# Patient Record
Sex: Female | Born: 1955 | Race: White | Hispanic: No | Marital: Single | Smoking: Never smoker
Health system: Southern US, Community
[De-identification: ages and names within clinical notes are randomized; demographics above are authoritative.]

## PROBLEM LIST (undated history)

## (undated) DIAGNOSIS — R002 Palpitations: Secondary | ICD-10-CM

## (undated) DIAGNOSIS — K5790 Diverticulosis of intestine, part unspecified, without perforation or abscess without bleeding: Secondary | ICD-10-CM

## (undated) DIAGNOSIS — H409 Unspecified glaucoma: Secondary | ICD-10-CM

## (undated) DIAGNOSIS — B9689 Other specified bacterial agents as the cause of diseases classified elsewhere: Secondary | ICD-10-CM

## (undated) DIAGNOSIS — M8580 Other specified disorders of bone density and structure, unspecified site: Secondary | ICD-10-CM

## (undated) DIAGNOSIS — A6000 Herpesviral infection of urogenital system, unspecified: Secondary | ICD-10-CM

## (undated) DIAGNOSIS — R87810 Cervical high risk human papillomavirus (HPV) DNA test positive: Secondary | ICD-10-CM

## (undated) DIAGNOSIS — N760 Acute vaginitis: Secondary | ICD-10-CM

## (undated) DIAGNOSIS — K219 Gastro-esophageal reflux disease without esophagitis: Secondary | ICD-10-CM

## (undated) DIAGNOSIS — M1990 Unspecified osteoarthritis, unspecified site: Secondary | ICD-10-CM

## (undated) DIAGNOSIS — R011 Cardiac murmur, unspecified: Secondary | ICD-10-CM

## (undated) DIAGNOSIS — R519 Headache, unspecified: Secondary | ICD-10-CM

## (undated) DIAGNOSIS — T7840XA Allergy, unspecified, initial encounter: Secondary | ICD-10-CM

## (undated) DIAGNOSIS — G454 Transient global amnesia: Secondary | ICD-10-CM

## (undated) HISTORY — DX: Acute vaginitis: N76.0

## (undated) HISTORY — PX: TONSILLECTOMY: SUR1361

## (undated) HISTORY — DX: Other specified bacterial agents as the cause of diseases classified elsewhere: B96.89

## (undated) HISTORY — DX: Allergy, unspecified, initial encounter: T78.40XA

## (undated) HISTORY — PX: FACELIFT: SHX1566

## (undated) HISTORY — PX: LIPOSUCTION: SHX10

## (undated) HISTORY — DX: Cervical high risk human papillomavirus (HPV) DNA test positive: R87.810

## (undated) HISTORY — DX: Unspecified glaucoma: H40.9

## (undated) HISTORY — DX: Herpesviral infection of urogenital system, unspecified: A60.00

## (undated) HISTORY — DX: Other specified disorders of bone density and structure, unspecified site: M85.80

## (undated) HISTORY — DX: Diverticulosis of intestine, part unspecified, without perforation or abscess without bleeding: K57.90

## (undated) HISTORY — PX: FOOT SURGERY: SHX648

---
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
## 1977-05-09 HISTORY — PX: CRYOTHERAPY: SHX1416

## 2004-03-10 ENCOUNTER — Ambulatory Visit: Payer: Self-pay

## 2005-03-23 ENCOUNTER — Ambulatory Visit: Payer: Self-pay | Admitting: Family Medicine

## 2005-04-13 ENCOUNTER — Ambulatory Visit: Payer: Self-pay | Admitting: Family Medicine

## 2006-02-20 ENCOUNTER — Ambulatory Visit: Payer: Self-pay | Admitting: Family Medicine

## 2006-04-19 ENCOUNTER — Ambulatory Visit: Payer: Self-pay | Admitting: Family Medicine

## 2006-08-25 ENCOUNTER — Ambulatory Visit: Payer: Self-pay | Admitting: Gastroenterology

## 2007-06-13 ENCOUNTER — Ambulatory Visit: Payer: Self-pay | Admitting: Family Medicine

## 2007-08-23 ENCOUNTER — Ambulatory Visit: Payer: Self-pay | Admitting: Family Medicine

## 2008-05-09 DIAGNOSIS — R87810 Cervical high risk human papillomavirus (HPV) DNA test positive: Secondary | ICD-10-CM

## 2008-05-09 HISTORY — DX: Cervical high risk human papillomavirus (HPV) DNA test positive: R87.810

## 2008-06-17 ENCOUNTER — Ambulatory Visit: Payer: Self-pay | Admitting: Family Medicine

## 2009-06-23 ENCOUNTER — Ambulatory Visit: Payer: Self-pay

## 2010-06-29 ENCOUNTER — Ambulatory Visit: Payer: Self-pay

## 2010-07-01 ENCOUNTER — Ambulatory Visit: Payer: Self-pay

## 2011-06-23 ENCOUNTER — Ambulatory Visit: Payer: Self-pay | Admitting: Gastroenterology

## 2011-07-05 ENCOUNTER — Ambulatory Visit: Payer: Self-pay

## 2011-07-29 ENCOUNTER — Ambulatory Visit: Payer: Self-pay | Admitting: Gastroenterology

## 2012-07-05 ENCOUNTER — Ambulatory Visit: Payer: Self-pay

## 2013-03-29 ENCOUNTER — Emergency Department: Payer: Self-pay | Admitting: Emergency Medicine

## 2013-03-29 LAB — URINALYSIS, COMPLETE
Bilirubin,UR: NEGATIVE
Blood: NEGATIVE
Hyaline Cast: 5
Leukocyte Esterase: NEGATIVE
Nitrite: NEGATIVE
Ph: 5 (ref 4.5–8.0)
Protein: 30
RBC,UR: 2 /HPF (ref 0–5)
Specific Gravity: 1.021 (ref 1.003–1.030)
Squamous Epithelial: 2

## 2013-03-29 LAB — CBC
HCT: 42 % (ref 35.0–47.0)
HGB: 14.4 g/dL (ref 12.0–16.0)
MCH: 33 pg (ref 26.0–34.0)
MCHC: 34.2 g/dL (ref 32.0–36.0)
MCV: 96 fL (ref 80–100)
Platelet: 222 10*3/uL (ref 150–440)
WBC: 6.4 10*3/uL (ref 3.6–11.0)

## 2013-03-29 LAB — BASIC METABOLIC PANEL
Anion Gap: 6 — ABNORMAL LOW (ref 7–16)
Calcium, Total: 8.7 mg/dL (ref 8.5–10.1)
Co2: 25 mmol/L (ref 21–32)
Glucose: 113 mg/dL — ABNORMAL HIGH (ref 65–99)
Sodium: 141 mmol/L (ref 136–145)

## 2013-03-29 LAB — TROPONIN I: Troponin-I: <0.02 ng/mL

## 2013-07-08 ENCOUNTER — Ambulatory Visit: Payer: Self-pay | Admitting: Obstetrics and Gynecology

## 2014-05-09 DIAGNOSIS — A6000 Herpesviral infection of urogenital system, unspecified: Secondary | ICD-10-CM

## 2014-05-09 HISTORY — DX: Herpesviral infection of urogenital system, unspecified: A60.00

## 2014-07-15 ENCOUNTER — Ambulatory Visit: Payer: Self-pay | Admitting: Obstetrics and Gynecology

## 2015-05-28 ENCOUNTER — Other Ambulatory Visit: Payer: Self-pay | Admitting: Obstetrics and Gynecology

## 2015-05-28 DIAGNOSIS — Z1231 Encounter for screening mammogram for malignant neoplasm of breast: Secondary | ICD-10-CM

## 2015-06-10 SURGERY — FIXATION, FEMUR, NECK, PERCUTANEOUS, USING SCREW
Anesthesia: Choice | Laterality: Left

## 2015-07-24 ENCOUNTER — Ambulatory Visit
Admission: RE | Admit: 2015-07-24 | Discharge: 2015-07-24 | Disposition: A | Discharge status: Home / Self Care | Payer: 59 | Source: Ambulatory Visit | Attending: Obstetrics and Gynecology | Admitting: Obstetrics and Gynecology

## 2015-07-24 DIAGNOSIS — Z1231 Encounter for screening mammogram for malignant neoplasm of breast: Secondary | ICD-10-CM | POA: Diagnosis not present

## 2015-08-10 ENCOUNTER — Ambulatory Visit
Admission: RE | Admit: 2015-08-10 | Discharge: 2015-08-10 | Disposition: A | Discharge status: Home / Self Care | Payer: 59 | Source: Ambulatory Visit | Attending: Family Medicine | Admitting: Family Medicine

## 2015-08-10 ENCOUNTER — Ambulatory Visit (INDEPENDENT_AMBULATORY_CARE_PROVIDER_SITE_OTHER): Payer: Self-pay | Admitting: Family Medicine

## 2015-08-10 ENCOUNTER — Encounter: Payer: Self-pay | Admitting: Family Medicine

## 2015-08-10 ENCOUNTER — Other Ambulatory Visit: Payer: Self-pay | Admitting: Family Medicine

## 2015-08-10 VITALS — BP 123/67 | HR 57 | Temp 98.5°F | Resp 16 | Ht 61.5 in | Wt 138.0 lb

## 2015-08-10 DIAGNOSIS — J189 Pneumonia, unspecified organism: Secondary | ICD-10-CM

## 2015-08-10 DIAGNOSIS — J309 Allergic rhinitis, unspecified: Secondary | ICD-10-CM | POA: Insufficient documentation

## 2015-08-10 DIAGNOSIS — R058 Other specified cough: Secondary | ICD-10-CM

## 2015-08-10 DIAGNOSIS — R05 Cough: Secondary | ICD-10-CM

## 2015-08-10 DIAGNOSIS — R918 Other nonspecific abnormal finding of lung field: Secondary | ICD-10-CM | POA: Diagnosis not present

## 2015-08-10 DIAGNOSIS — J301 Allergic rhinitis due to pollen: Secondary | ICD-10-CM

## 2015-08-10 MED ORDER — FLUTICASONE PROPIONATE 50 MCG/ACT NA SUSP: 2.0000 | Freq: Every day | NASAL | Status: DC

## 2015-08-10 MED ORDER — CETIRIZINE HCL 10 MG PO TABS: 10.0000 mg | ORAL_TABLET | Freq: Every day | ORAL | Status: DC

## 2015-08-10 MED ORDER — BENZONATATE 100 MG PO CAPS: 100.0000 mg | ORAL_CAPSULE | Freq: Three times a day (TID) | ORAL | Status: DC | PRN

## 2015-08-10 MED ORDER — DM-GUAIFENESIN ER 30-600 MG PO TB12: 1.0000 | ORAL_TABLET | Freq: Two times a day (BID) | ORAL | Status: DC

## 2015-08-10 MED ORDER — PREDNISONE 20 MG PO TABS: 40.0000 mg | ORAL_TABLET | Freq: Every day | ORAL | Status: AC

## 2015-08-10 NOTE — Progress Notes (Signed)
Subjective:    Patient ID: Kellie Willis, female    DOB: Oct 20, 1955, 60 y.o.   MRN: [id number]  HPI: Kellie Willis is a 60 y.o. female presenting on 08/10/2015 for Cough   HPI  Pt presents for cough x 3 weeks. She states it is improving over past 3 weeks. Doesn't keep her awake. Family member recently had the flu- she had fever and coughing 3 weeks ago- no formal flu diagnoses. Feels like there is something there. Cough is no longer productive- was previously productive- clear sputum. No chest tightness. No shortness of breath. No wheezing. No sinus pressure or congestion. No post nasal drip. 1 day of fever when initially sick.  Some history of Acid reflux. Taking flonase PRN for allergy symptoms.  Home treatment: Mucinex, Cough syrup- is effective but cough continues.  Past Medical History  Diagnosis Date  . Allergy     No current outpatient prescriptions on file prior to visit.   No current facility-administered medications on file prior to visit.    Review of Systems  Constitutional: Negative for fever and chills.  HENT: Positive for postnasal drip. Negative for congestion, ear discharge, ear pain and sore throat.   Respiratory: Positive for cough and shortness of breath. Negative for chest tightness and wheezing.   Cardiovascular: Negative for chest pain and leg swelling.  Gastrointestinal: Negative for nausea, vomiting, abdominal pain, diarrhea and constipation.  Endocrine: Negative.  Negative for cold intolerance, heat intolerance, polydipsia, polyphagia and polyuria.  Genitourinary: Negative for dysuria and difficulty urinating.  Musculoskeletal: Negative.   Neurological: Negative for dizziness, light-headedness and numbness.  Psychiatric/Behavioral: Negative.    Per HPI unless specifically indicated above     Objective:    BP 123/67 mmHg  Pulse 57  Temp(Src) 98.5 F (36.9 C) (Oral)  Resp 16  Ht 5' 1.5" (1.562 m)  Wt 138 lb (62.596 kg)  BMI 25.66 kg/m2  SpO2  97%  Wt Readings from Last 3 Encounters:  08/10/15 138 lb (62.596 kg)    Physical Exam  Constitutional: She is oriented to person, place, and time. She appears well-developed and well-nourished.  HENT:  Head: Normocephalic and atraumatic.  Right Ear: Hearing normal. Tympanic membrane is scarred.  Left Ear: Hearing and tympanic membrane normal.  Nose: Mucosal edema present. No rhinorrhea or sinus tenderness. Right sinus exhibits no maxillary sinus tenderness and no frontal sinus tenderness. Left sinus exhibits no maxillary sinus tenderness and no frontal sinus tenderness.  Mouth/Throat: Oropharynx is clear and moist and mucous membranes are normal. No posterior oropharyngeal edema, posterior oropharyngeal erythema or tonsillar abscesses.  Boggy turbinates.   Neck: Normal range of motion. Neck supple.  Cardiovascular: Normal rate, regular rhythm and normal heart sounds.  Exam reveals no gallop and no friction rub.   No murmur heard. Pulmonary/Chest: Effort normal and breath sounds normal. No respiratory distress. She has no decreased breath sounds. She has no wheezes. She has no rhonchi. She has no rales. Chest wall is not dull to percussion. She exhibits no tenderness.  Abdominal: Soft. Normal appearance and bowel sounds are normal.  Musculoskeletal: Normal range of motion. She exhibits no edema or tenderness.  Lymphadenopathy:    She has no cervical adenopathy.  Neurological: She is alert and oriented to person, place, and time.  Skin: Skin is warm and dry.   Results for orders placed or performed in visit on 03/29/13  Troponin I  Result Value Ref Range   Troponin-I < 0.02 ng/mL  CBC  Result Value Ref Range   WBC 6.4 3.6-11.0 x10 3/mm 3   RBC 4.36 3.80-5.20 X10 6/mm 3   HGB 14.4 12.0-16.0 g/dL   HCT 42.0 35.0-47.0 %   MCV 96 80-100 fL   MCH 33.0 26.0-34.0 pg   MCHC 34.2 32.0-36.0 g/dL   RDW 12.3 11.5-14.5 %   Platelet 222 150-440 x10 3/mm 3  Basic metabolic panel  Result Value  Ref Range   Glucose 113 (H) 65-99 mg/dL   BUN 18 7-18 mg/dL   Creatinine 0.98 0.60-1.30 mg/dL   Sodium 141 136-145 mmol/L   Potassium 3.5 3.5-5.1 mmol/L   Chloride 110 (H) 98-107 mmol/L   Co2 25 21-32 mmol/L   Calcium, Total 8.7 8.5-10.1 mg/dL   Osmolality 284 275-301   Anion Gap 6 (L) 7-16   EGFR (African American) >60    EGFR (Non-African Amer.) >60   Urinalysis, Complete  Result Value Ref Range   Color - urine Yellow    Clarity - urine Hazy    Glucose,UR Negative 0-75 mg/dL   Bilirubin,UR Negative NEGATIVE   Ketone Negative NEGATIVE   Specific Gravity 1.021 1.003-1.030   Blood Negative NEGATIVE   Ph 5.0 4.5-8.0   Protein 30 mg/dL NEGATIVE   Nitrite Negative NEGATIVE   Leukocyte Esterase Negative NEGATIVE   RBC,UR 2 /HPF 0-5 /HPF   WBC UR 1 /HPF 0-5 /HPF   Bacteria TRACE NONE SEEN   Squamous Epithelial 2 /HPF    Mucous PRESENT    Hyaline Cast 5 /LPF       Assessment & Plan:   Problem List Items Addressed This Visit      Respiratory   Allergic rhinitis - Primary    May be allergic componenet to cough. Encouraged pt to continue daily flonase and add 2nd generation anti-histamine to help ameliorate symptoms.       Relevant Medications   fluticasone (FLONASE) 50 MCG/ACT nasal spray   cetirizine (ZYRTEC) 10 MG tablet    Other Visit Diagnoses    Post-viral cough syndrome        Likely 2/2 possible flu 3 weeks ago. CXR to r/o pneumonia. Treat symptomatically. Consider prednisone. Alarm symptoms reviewed. Return if not improving.     Relevant Medications    benzonatate (TESSALON) 100 MG capsule    dextromethorphan-guaiFENesin (MUCINEX DM) 30-600 MG 12hr tablet    Other Relevant Orders    DG Chest 2 View       Meds ordered this encounter  Medications  . benzonatate (TESSALON) 100 MG capsule    Sig: Take 1 capsule (100 mg total) by mouth 3 (three) times daily as needed.    Dispense:  30 capsule    Refill:  0    Order Specific Question:  Supervising Provider     Answer:  Arlis Porta [[id number]]  . dextromethorphan-guaiFENesin (MUCINEX DM) 30-600 MG 12hr tablet    Sig: Take 1 tablet by mouth 2 (two) times daily.    Dispense:  20 tablet    Refill:  0    Order Specific Question:  Supervising Provider    Answer:  Arlis Porta [phone]  . fluticasone (FLONASE) 50 MCG/ACT nasal spray    Sig: Place 2 sprays into both nostrils daily.    Dispense:  16 g    Refill:  11    Order Specific Question:  Supervising Provider    Answer:  Arlis Porta [phone]  . cetirizine (ZYRTEC) 10 MG tablet  Sig: Take 1 tablet (10 mg total) by mouth daily.    Dispense:  30 tablet    Refill:  11    Order Specific Question:  Supervising Provider    Answer:  Arlis Porta [phone]      Follow up plan: Return if symptoms worsen or fail to improve.

## 2015-08-10 NOTE — Patient Instructions (Signed)
I think your cough is lingering inflammation from your previously illness. You can use supportive care at home to help with your symptoms. I have sent Mucinex DM to your pharmacy to help break up the congestion and soothe your cough. You can takes this twice daily.  I have also sent tesslon perles to your pharmacy to help with the cough- you can take these 3 times daily as needed. Honey is a natural cough suppressant- so add it to your tea in the morning.  If you have a humidifer, set that up in your bedroom at night.   Please seek immediate medical attention if you develop shortness of breath not relieve by inhaler, chest pain/tightness, fever > 103 F or other concerning symptoms.   IF you cough is not improving and you want to try prednisone, please let me know!

## 2015-08-10 NOTE — Assessment & Plan Note (Signed)
May be allergic componenet to cough. Encouraged pt to continue daily flonase and add 2nd generation anti-histamine to help ameliorate symptoms.

## 2015-08-13 ENCOUNTER — Telehealth: Payer: Self-pay | Admitting: Family Medicine

## 2015-08-13 NOTE — Telephone Encounter (Signed)
Called pt to ensure she got prednisone. She did get it. Started Wednesday. Feels like she is coughing more. Pt will call on Monday after finishing steroids if symptoms are not any better.

## 2015-08-14 ENCOUNTER — Telehealth: Payer: Self-pay | Admitting: Family Medicine

## 2015-08-14 MED ORDER — AZITHROMYCIN 250 MG PO TABS: ORAL_TABLET | ORAL | Status: DC

## 2015-08-14 NOTE — Telephone Encounter (Signed)
Pt said she is coughing up some yellow mucus now after taking the steroids.  Should she continue medication or do something different?  Please call [phone]

## 2015-08-14 NOTE — Telephone Encounter (Signed)
Pt advised as per Amy. 

## 2015-08-14 NOTE — Telephone Encounter (Signed)
Let's add an antibiotic to the steroids. I will send in a Zpak. 2 pills today and 1 pill daily until bottle complete. Please let her know! Thanks! AK

## 2015-08-18 ENCOUNTER — Telehealth: Payer: Self-pay | Admitting: Family Medicine

## 2015-08-18 DIAGNOSIS — R05 Cough: Secondary | ICD-10-CM

## 2015-08-18 DIAGNOSIS — R059 Cough, unspecified: Secondary | ICD-10-CM

## 2015-08-18 MED ORDER — PREDNISONE 10 MG PO TABS: ORAL_TABLET | ORAL | Status: DC

## 2015-08-18 NOTE — Telephone Encounter (Signed)
Pt has completed Zpak and is still coughing. Prednisone helped a little. Will try a longer round of prednisone. Will repeat CXR in 2 weeks and plan to send to pulmonology if inflammation has not improved.

## 2015-08-18 NOTE — Telephone Encounter (Signed)
Pt.wanted to know what she needed to do you have called  a Z pac in  , she had a Xray done she states she was still coughing

## 2015-09-08 ENCOUNTER — Encounter: Payer: Self-pay | Admitting: Family Medicine

## 2015-09-08 ENCOUNTER — Ambulatory Visit
Admission: RE | Admit: 2015-09-08 | Discharge: 2015-09-08 | Disposition: A | Discharge status: Home / Self Care | Payer: 59 | Source: Ambulatory Visit | Attending: Family Medicine | Admitting: Family Medicine

## 2015-09-08 ENCOUNTER — Other Ambulatory Visit: Payer: Self-pay | Admitting: Family Medicine

## 2015-09-08 ENCOUNTER — Ambulatory Visit: Payer: Self-pay | Admitting: Family Medicine

## 2015-09-08 DIAGNOSIS — J189 Pneumonia, unspecified organism: Secondary | ICD-10-CM

## 2015-09-08 DIAGNOSIS — Z8701 Personal history of pneumonia (recurrent): Secondary | ICD-10-CM | POA: Insufficient documentation

## 2015-09-08 DIAGNOSIS — Z09 Encounter for follow-up examination after completed treatment for conditions other than malignant neoplasm: Secondary | ICD-10-CM | POA: Insufficient documentation

## 2016-03-28 ENCOUNTER — Other Ambulatory Visit: Payer: Self-pay | Admitting: Obstetrics and Gynecology

## 2016-03-28 DIAGNOSIS — Z1231 Encounter for screening mammogram for malignant neoplasm of breast: Secondary | ICD-10-CM

## 2016-05-09 HISTORY — PX: COLONOSCOPY: SHX174

## 2016-07-21 DIAGNOSIS — M7542 Impingement syndrome of left shoulder: Secondary | ICD-10-CM | POA: Diagnosis not present

## 2016-07-21 DIAGNOSIS — M79671 Pain in right foot: Secondary | ICD-10-CM | POA: Diagnosis not present

## 2016-07-26 ENCOUNTER — Ambulatory Visit
Admission: RE | Admit: 2016-07-26 | Discharge: 2016-07-26 | Disposition: A | Discharge status: Home / Self Care | Payer: 59 | Source: Ambulatory Visit | Attending: Obstetrics and Gynecology | Admitting: Obstetrics and Gynecology

## 2016-07-26 DIAGNOSIS — Z1231 Encounter for screening mammogram for malignant neoplasm of breast: Secondary | ICD-10-CM | POA: Diagnosis not present

## 2016-09-16 DIAGNOSIS — D2272 Melanocytic nevi of left lower limb, including hip: Secondary | ICD-10-CM | POA: Diagnosis not present

## 2016-09-16 DIAGNOSIS — D225 Melanocytic nevi of trunk: Secondary | ICD-10-CM | POA: Diagnosis not present

## 2016-10-07 DIAGNOSIS — Z8371 Family history of colonic polyps: Secondary | ICD-10-CM | POA: Diagnosis not present

## 2016-10-07 DIAGNOSIS — R195 Other fecal abnormalities: Secondary | ICD-10-CM | POA: Diagnosis not present

## 2016-11-01 DIAGNOSIS — H524 Presbyopia: Secondary | ICD-10-CM | POA: Diagnosis not present

## 2016-11-01 DIAGNOSIS — H5203 Hypermetropia, bilateral: Secondary | ICD-10-CM | POA: Diagnosis not present

## 2016-11-14 DIAGNOSIS — H04123 Dry eye syndrome of bilateral lacrimal glands: Secondary | ICD-10-CM | POA: Diagnosis not present

## 2016-11-14 DIAGNOSIS — H18453 Nodular corneal degeneration, bilateral: Secondary | ICD-10-CM | POA: Diagnosis not present

## 2016-11-28 DIAGNOSIS — H18453 Nodular corneal degeneration, bilateral: Secondary | ICD-10-CM | POA: Diagnosis not present

## 2016-11-28 DIAGNOSIS — H40053 Ocular hypertension, bilateral: Secondary | ICD-10-CM | POA: Diagnosis not present

## 2016-12-07 DIAGNOSIS — K5790 Diverticulosis of intestine, part unspecified, without perforation or abscess without bleeding: Secondary | ICD-10-CM | POA: Diagnosis not present

## 2016-12-07 DIAGNOSIS — K5730 Diverticulosis of large intestine without perforation or abscess without bleeding: Secondary | ICD-10-CM | POA: Diagnosis not present

## 2016-12-07 DIAGNOSIS — Z1211 Encounter for screening for malignant neoplasm of colon: Secondary | ICD-10-CM | POA: Diagnosis not present

## 2016-12-07 DIAGNOSIS — Z800 Family history of malignant neoplasm of digestive organs: Secondary | ICD-10-CM | POA: Diagnosis not present

## 2016-12-07 DIAGNOSIS — Z8371 Family history of colonic polyps: Secondary | ICD-10-CM | POA: Diagnosis not present

## 2016-12-07 LAB — HM COLONOSCOPY

## 2016-12-12 DIAGNOSIS — H40053 Ocular hypertension, bilateral: Secondary | ICD-10-CM | POA: Diagnosis not present

## 2017-01-10 DIAGNOSIS — H40053 Ocular hypertension, bilateral: Secondary | ICD-10-CM | POA: Diagnosis not present

## 2017-01-26 DIAGNOSIS — Z23 Encounter for immunization: Secondary | ICD-10-CM | POA: Diagnosis not present

## 2017-03-06 ENCOUNTER — Ambulatory Visit (INDEPENDENT_AMBULATORY_CARE_PROVIDER_SITE_OTHER): Payer: 59 | Admitting: Obstetrics and Gynecology

## 2017-03-06 ENCOUNTER — Encounter: Payer: Self-pay | Admitting: Obstetrics and Gynecology

## 2017-03-06 VITALS — BP 120/80 | Ht 61.5 in | Wt 138.0 lb

## 2017-03-06 DIAGNOSIS — Z1231 Encounter for screening mammogram for malignant neoplasm of breast: Secondary | ICD-10-CM | POA: Diagnosis not present

## 2017-03-06 DIAGNOSIS — Z124 Encounter for screening for malignant neoplasm of cervix: Secondary | ICD-10-CM

## 2017-03-06 DIAGNOSIS — A6004 Herpesviral vulvovaginitis: Secondary | ICD-10-CM

## 2017-03-06 DIAGNOSIS — N952 Postmenopausal atrophic vaginitis: Secondary | ICD-10-CM | POA: Diagnosis not present

## 2017-03-06 DIAGNOSIS — Z01419 Encounter for gynecological examination (general) (routine) without abnormal findings: Secondary | ICD-10-CM | POA: Diagnosis not present

## 2017-03-06 DIAGNOSIS — J302 Other seasonal allergic rhinitis: Secondary | ICD-10-CM | POA: Insufficient documentation

## 2017-03-06 DIAGNOSIS — Z1239 Encounter for other screening for malignant neoplasm of breast: Secondary | ICD-10-CM

## 2017-03-06 MED ORDER — FLUTICASONE PROPIONATE 50 MCG/ACT NA SUSP: 2.0000 | Freq: Every day | NASAL | 2 refills | Status: DC

## 2017-03-06 MED ORDER — ESTRADIOL 0.1 MG/GM VA CREA: TOPICAL_CREAM | VAGINAL | 1 refills | Status: DC

## 2017-03-06 MED ORDER — VALACYCLOVIR HCL 1 G PO TABS: 500.0000 mg | ORAL_TABLET | Freq: Two times a day (BID) | ORAL | 1 refills | Status: DC

## 2017-03-06 NOTE — Progress Notes (Signed)
PCP: Kellie Willis, No Pcp Per   Chief Complaint  Kellie Willis presents with  . Gynecologic Exam    HPI:      [name] is a 61 y.o. G1P1001 who LMP was No LMP recorded. Kellie Willis is postmenopausal., presents today for her annual examination.  Her menses are absent due to menopause. She does not have intermenstrual bleeding.  She does not have vasomotor sx.   Sex activity: single partner, contraception - post menopausal status. She does have vaginal dryness, uses estrace crm with sx relief.  Last Pap: February 24, 2016  Results were: no abnormalities /neg HPV DNA. Pt likes yearly paps. Hx of STDs: HSV, HPV. She takes valtrex prn outbreaks, which are rare.  Last mammogram: July 26, 2016  Results were: normal--routine follow-up in 12 months There is a FH of breast cancer in her PGM, genetic testing not indicated. There is no FH of ovarian cancer. The Kellie Willis does not do self-breast exams.  Colonoscopy: colonoscopy last month without abnormalities.  Repeat due after 5 years due to [place] of poyps.  DEXA: 2012--osteopenia in hip.  Tobacco use: The Kellie Willis denies current or previous tobacco use. Alcohol use: none Exercise: moderately active  She does get adequate calcium and Vitamin D in her diet.  Normal lipids 2017.   Past Medical History:  Diagnosis Date  . Allergy   . BV (bacterial vaginosis)   . 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 Diverticulosis   . Genital herpes 2016  . Glaucoma (increased eye pressure)   . Osteopenia     Past Surgical History:  Procedure Laterality Date  . COLONOSCOPY  2018   neg; repeat in 5 yrs due to [place] polyps  . CRYOTHERAPY  1979  . LIPOSUCTION    . TONSILLECTOMY      Family History  Problem Relation Age of Onset  . Breast cancer Paternal Grandmother 3  . Hypertension Mother   . Cervical cancer Mother   . Colon polyps Sister     Social History   Social History  . Marital status: Divorced    Spouse name:  N/A  . Number of children: N/A  . Years of education: N/A   Occupational History  . Not on file.   Social History Main Topics  . Smoking status: Never Smoker  . Smokeless tobacco: Never Used  . Alcohol use Yes  . Drug use: No  . Sexual activity: Yes   Other Topics Concern  . Not on file   Social History Narrative  . No narrative on file    Current Meds  Medication Sig  . COMBIGAN 0.2-0.5 % ophthalmic solution INSTILL 1 DROP BY OPHTHALMIC ROUTE EVERY 12 HOURS INTO RIGHT EYE  . fluticasone (FLONASE) 50 MCG/ACT nasal spray Place 2 sprays into both nostrils daily.  Marland Kitchen latanoprost (XALATAN) 0.005 % ophthalmic solution INSTILL 1 DROP INTO BOTH EYES EVERY DAY IN THE EVENING  . RESTASIS 0.05 % ophthalmic emulsion INSTILL 1 DROP BY OPHTHALMIC ROUTE EVERY 12 HOURS OU  . valACYclovir (VALTREX) 1000 MG tablet Take 0.5 tablets (500 mg total) by mouth 2 (two) times daily. X 3 days prn sx  . [DISCONTINUED] fluticasone (FLONASE) 50 MCG/ACT nasal spray Place 2 sprays into both nostrils daily.  . [DISCONTINUED] valACYclovir (VALTREX) 1000 MG tablet Take 500 mg by mouth 2 (two) times daily.      ROS:  Review of Systems  Constitutional: Negative for fatigue, fever and unexpected weight change.  Respiratory: Negative for cough, shortness of breath and wheezing.   Cardiovascular: Negative for chest pain, palpitations and leg swelling.  Gastrointestinal: Negative for blood in stool, constipation, diarrhea, nausea and vomiting.  Endocrine: Negative for cold intolerance, heat intolerance and polyuria.  Genitourinary: Negative for dyspareunia, dysuria, flank pain, frequency, genital sores, hematuria, menstrual problem, pelvic pain, urgency, vaginal bleeding, vaginal discharge and vaginal pain.  Musculoskeletal: Negative for back pain, joint swelling and myalgias.  Skin: Negative for rash.  Neurological: Negative for dizziness, syncope, light-headedness, numbness and headaches.  Hematological:  Negative for adenopathy.  Psychiatric/Behavioral: Negative for agitation, confusion, sleep disturbance and suicidal ideas. The Kellie Willis is not nervous/anxious.      Objective: BP 120/80   Ht 5' 1.5" (1.562 m)   Wt 138 lb (62.6 kg)   BMI 25.65 kg/m    Physical Exam  Constitutional: She is oriented to person, place, and time. She appears well-developed and well-nourished.  Genitourinary: Vagina normal and uterus normal. There is no rash or tenderness on the right labia. There is no rash or tenderness on the left labia. No erythema or tenderness in the vagina. No vaginal discharge found. Right adnexum does not display mass and does not display tenderness. Left adnexum does not display mass and does not display tenderness. Cervix does not exhibit motion tenderness or polyp. Uterus is not enlarged or tender.  Neck: Normal range of motion. No thyromegaly present.  Cardiovascular: Normal rate, regular rhythm and normal heart sounds.   No murmur heard. Pulmonary/Chest: Effort normal and breath sounds normal. Right breast exhibits no mass, no nipple discharge, no skin change and no tenderness. Left breast exhibits no mass, no nipple discharge, no skin change and no tenderness.  Abdominal: Soft. There is no tenderness. There is no guarding.  Musculoskeletal: Normal range of motion.  Neurological: She is alert and oriented to person, place, and time. No cranial nerve deficit.  Psychiatric: She has a normal mood and affect. Her behavior is normal.  Vitals reviewed.   Assessment/Plan:  Encounter for annual routine gynecological examination  Cervical cancer screening - Pt likes yearly paps. - Plan: Pap IG (Image Guided)  Screening for breast cancer - Pt to sched mammo 3/19. - Plan: MM DIGITAL SCREENING BILATERAL  Vaginal atrophy - Rx RF estrace crm.  - Plan: estradiol (ESTRACE) 0.1 MG/GM vaginal cream  Herpes simplex vulvovaginitis - Rx RF valtrex prn. - Plan: valACYclovir (VALTREX) 1000 MG  tablet  Seasonal allergies - Rx RF flonase. - Plan: fluticasone (FLONASE) 50 MCG/ACT nasal spray         GYN counsel mammography screening, adequate intake of calcium and vitamin D, diet and exercise    F/U  Return in about 1 year (around 03/06/2018).  Nhung Danko B. Lashane Whelpley, PA-C 03/06/2017 9:03 AM

## 2017-03-07 LAB — PAP IG (IMAGE GUIDED): PAP Smear Comment: 0

## 2017-03-13 ENCOUNTER — Telehealth: Payer: Self-pay

## 2017-03-13 ENCOUNTER — Other Ambulatory Visit: Payer: Self-pay | Admitting: Obstetrics and Gynecology

## 2017-03-13 DIAGNOSIS — Z113 Encounter for screening for infections with a predominantly sexual mode of transmission: Secondary | ICD-10-CM

## 2017-03-13 NOTE — Telephone Encounter (Signed)
RN to notify pt that we are trying to figure out this updating in chart and if it corresponds to [facility]. I updated her flu, mammo and pap smear. Ask pt to give it a few days to see if it "works". If not, she needs to contact myChart because I don't know what else to do. If it does work, can she let us know so we can see if this is the process for pts? Also, I don't know about her TdaP. I will put hep C order in. Pt to come to office--call day before for lab order.

## 2017-03-13 NOTE — Telephone Encounter (Signed)
Pt states records on MyChart say her cancer screening is overdue - she had it done 8/1st, flu shot is overdue - she had it 9/20th.  She would like for that to be updated.  She would also like to go ahead and be tested for Hep C and if needed she will get her tetanus - it says 'overdue'.  Does she need to sched c [facility] or what to do?  [phone]

## 2017-03-14 NOTE — Telephone Encounter (Signed)
Pt aware.

## 2017-03-15 ENCOUNTER — Other Ambulatory Visit: Payer: 59

## 2017-03-15 ENCOUNTER — Encounter: Payer: Self-pay | Admitting: Obstetrics and Gynecology

## 2017-03-15 DIAGNOSIS — Z113 Encounter for screening for infections with a predominantly sexual mode of transmission: Secondary | ICD-10-CM

## 2017-03-16 ENCOUNTER — Encounter: Payer: Self-pay | Admitting: Obstetrics and Gynecology

## 2017-03-16 LAB — HEPATITIS C ANTIBODY: Hep C Virus Ab: <0.1 s/co ratio (ref 0.0–0.9)

## 2017-04-03 ENCOUNTER — Telehealth: Payer: Self-pay

## 2017-04-03 NOTE — Telephone Encounter (Signed)
Spoke w/pt. Notified that we do not carry the Shingles vaccine. Recommended checking w/other pharmacies, urgent care, Health [facility].

## 2017-04-03 NOTE — Telephone Encounter (Signed)
Pt wants Shingles vaccine. [facility] is out. Thought she would check w/us since [facility] is her provider. Cb#[phone].

## 2017-04-11 ENCOUNTER — Other Ambulatory Visit: Payer: Self-pay

## 2017-04-12 ENCOUNTER — Ambulatory Visit (INDEPENDENT_AMBULATORY_CARE_PROVIDER_SITE_OTHER): Payer: 59

## 2017-04-12 ENCOUNTER — Encounter: Payer: Self-pay | Admitting: Podiatry

## 2017-04-12 ENCOUNTER — Ambulatory Visit: Payer: 59 | Admitting: Podiatry

## 2017-04-12 VITALS — BP 131/80 | HR 68 | Resp 16

## 2017-04-12 DIAGNOSIS — M779 Enthesopathy, unspecified: Principal | ICD-10-CM

## 2017-04-12 DIAGNOSIS — G5762 Lesion of plantar nerve, left lower limb: Secondary | ICD-10-CM | POA: Diagnosis not present

## 2017-04-12 DIAGNOSIS — G5782 Other specified mononeuropathies of left lower limb: Secondary | ICD-10-CM

## 2017-04-12 DIAGNOSIS — M7752 Other enthesopathy of left foot: Secondary | ICD-10-CM

## 2017-04-12 DIAGNOSIS — M778 Other enthesopathies, not elsewhere classified: Secondary | ICD-10-CM

## 2017-04-12 NOTE — Progress Notes (Signed)
Subjective:  Patient ID: Kellie Willis, female    DOB: 07-30-1955,  MRN: [id number] HPI Chief Complaint  Patient presents with  . Foot Pain    Plantar forefoot left - aching, numbness for 2-3 years, intermittent-depends on how much time she's on feet, worse with exercise/walking, tried OTC insoles-no help,     61 y.o. female presents with the above complaint.     Past Medical History:  Diagnosis Date  . Allergy   . BV (bacterial vaginosis)   . 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 Diverticulosis   . Genital herpes 2016  . Glaucoma (increased eye pressure)   . Osteopenia    Past Surgical History:  Procedure Laterality Date  . COLONOSCOPY  2018   neg; repeat in 5 yrs due to [place] polyps  . CRYOTHERAPY  1979  . LIPOSUCTION    . TONSILLECTOMY      Current Outpatient Medications:  .  cetirizine (ZYRTEC) 10 MG tablet, Take 1 tablet (10 mg total) by mouth daily. (Patient not taking: Reported on 03/06/2017), Disp: 30 tablet, Rfl: 11 .  COMBIGAN 0.2-0.5 % ophthalmic solution, INSTILL 1 DROP BY OPHTHALMIC ROUTE EVERY 12 HOURS INTO RIGHT EYE, Disp: , Rfl: 5 .  diazepam (VALIUM) 5 MG tablet, TAKE 1 TABLET ONE HOUR PRIOR PROCEDURE, Disp: , Rfl: 0 .  estradiol (ESTRACE VAGINAL) 0.1 MG/GM vaginal cream, Place vaginally., Disp: , Rfl:  .  estradiol (ESTRACE) 0.1 MG/GM vaginal cream, Insert 1 g once weekly as maintenace, Disp: 42.5 g, Rfl: 1 .  fluticasone (FLONASE) 50 MCG/ACT nasal spray, Place 2 sprays into both nostrils daily., Disp: 9.9 g, Rfl: 2 .  latanoprost (XALATAN) 0.005 % ophthalmic solution, INSTILL 1 DROP INTO BOTH EYES EVERY DAY IN THE EVENING, Disp: , Rfl: 5 .  RESTASIS 0.05 % ophthalmic emulsion, INSTILL 1 DROP BY OPHTHALMIC ROUTE EVERY 12 HOURS OU, Disp: , Rfl: 5 .  valACYclovir (VALTREX) 1000 MG tablet, Take 0.5 tablets (500 mg total) by mouth 2 (two) times daily. X 3 days prn sx, Disp: 30 tablet, Rfl: 1  No Known Allergies Review of Systems    All other systems reviewed and are negative.  Objective:  There were no vitals filed for this visit.  General: Well developed, nourished, in no acute distress, alert and oriented x3   Dermatological: Skin is warm, dry and supple bilateral. Nails x 10 are well maintained; remaining integument appears unremarkable at this time. There are no open sores, no preulcerative lesions, no rash or signs of infection present.  Vascular: Dorsalis Pedis artery and Posterior Tibial artery pedal pulses are 2/4 bilateral with immedate capillary fill time. Pedal hair growth present. No varicosities and no lower extremity edema present bilateral.   Neruologic: Grossly intact via light touch bilateral. Vibratory intact via tuning fork bilateral. Protective threshold with Semmes Wienstein monofilament intact to all pedal sites bilateral. Patellar and Achilles deep tendon reflexes 2+ bilateral. No Babinski or clonus noted bilateral.  Palpable Mulder's click to the third interdigital space of the left foot.  Musculoskeletal: No gross boney pedal deformities bilateral. No pain, crepitus, or limitation noted with foot and ankle range of motion bilateral. Muscular strength 5/5 in all groups tested bilateral.  Gait: Unassisted, Nonantalgic.    Radiographs:  3 views radiographs demonstrate rectus foot no fractures identified.  Assessment & Plan:   Assessment: Neuroma third interdigital space left foot.  Plan: Injected the third interdigital space today with Kenalog and local anesthetic.  Garrel Ridgel, DPM

## 2017-05-19 ENCOUNTER — Telehealth: Payer: Self-pay | Admitting: Podiatry

## 2017-05-19 NOTE — Telephone Encounter (Signed)
Patient called stating she was seen on 04/12/17 and got an injection in her foot. The injection helped, but patient wants to know if Dr. Milinda Pointer can give her a prescription for anti-imflam drug that will help with pain without her having to come back in for another injection?

## 2017-05-20 NOTE — Telephone Encounter (Signed)
She could try meloxicam 15 mg #30.  1 p.o. daily with 3 refills.

## 2017-05-22 MED ORDER — MELOXICAM 15 MG PO TABS: 15.0000 mg | ORAL_TABLET | Freq: Every day | ORAL | 3 refills | Status: DC

## 2017-05-22 NOTE — Telephone Encounter (Signed)
I informed pt of Dr. Stephenie Acres orders and she stated she has 2 bottles of the Meloxicam from a shoulder problem and will use those. I told pt she could use the once's Dr. Milinda Pointer prescribed as refills.

## 2017-05-22 NOTE — Addendum Note (Signed)
Addended by: Harriett Sine D on: 05/22/2017 09:07 AM   Modules accepted: Orders

## 2017-06-17 ENCOUNTER — Other Ambulatory Visit: Payer: Self-pay | Admitting: Obstetrics and Gynecology

## 2017-06-17 DIAGNOSIS — A6004 Herpesviral vulvovaginitis: Secondary | ICD-10-CM

## 2017-07-14 DIAGNOSIS — H18453 Nodular corneal degeneration, bilateral: Secondary | ICD-10-CM | POA: Diagnosis not present

## 2017-07-14 DIAGNOSIS — H40053 Ocular hypertension, bilateral: Secondary | ICD-10-CM | POA: Diagnosis not present

## 2017-07-28 ENCOUNTER — Ambulatory Visit
Admission: RE | Admit: 2017-07-28 | Discharge: 2017-07-28 | Disposition: A | Discharge status: Home / Self Care | Payer: 59 | Source: Ambulatory Visit | Attending: Obstetrics and Gynecology | Admitting: Obstetrics and Gynecology

## 2017-07-28 DIAGNOSIS — Z1231 Encounter for screening mammogram for malignant neoplasm of breast: Secondary | ICD-10-CM | POA: Diagnosis not present

## 2017-07-28 DIAGNOSIS — Z1239 Encounter for other screening for malignant neoplasm of breast: Secondary | ICD-10-CM

## 2017-07-30 ENCOUNTER — Encounter: Payer: Self-pay | Admitting: Obstetrics and Gynecology

## 2017-08-13 ENCOUNTER — Other Ambulatory Visit: Payer: Self-pay | Admitting: Obstetrics and Gynecology

## 2017-08-13 DIAGNOSIS — A6004 Herpesviral vulvovaginitis: Secondary | ICD-10-CM

## 2017-09-15 DIAGNOSIS — D2261 Melanocytic nevi of right upper limb, including shoulder: Secondary | ICD-10-CM | POA: Diagnosis not present

## 2017-09-15 DIAGNOSIS — D225 Melanocytic nevi of trunk: Secondary | ICD-10-CM | POA: Diagnosis not present

## 2017-09-15 DIAGNOSIS — D2262 Melanocytic nevi of left upper limb, including shoulder: Secondary | ICD-10-CM | POA: Diagnosis not present

## 2018-01-04 ENCOUNTER — Other Ambulatory Visit: Payer: Self-pay | Admitting: Obstetrics and Gynecology

## 2018-01-04 DIAGNOSIS — J302 Other seasonal allergic rhinitis: Secondary | ICD-10-CM

## 2018-01-04 NOTE — Telephone Encounter (Signed)
Please advise 

## 2018-01-04 NOTE — Telephone Encounter (Signed)
Pt aware.

## 2018-01-25 DIAGNOSIS — Z23 Encounter for immunization: Secondary | ICD-10-CM | POA: Diagnosis not present

## 2018-01-31 DIAGNOSIS — H18453 Nodular corneal degeneration, bilateral: Secondary | ICD-10-CM | POA: Diagnosis not present

## 2018-03-07 ENCOUNTER — Other Ambulatory Visit (HOSPITAL_COMMUNITY)
Admission: RE | Admit: 2018-03-07 | Discharge: 2018-03-07 | Disposition: A | Discharge status: Home / Self Care | Payer: 59 | Source: Ambulatory Visit | Attending: Obstetrics and Gynecology | Admitting: Obstetrics and Gynecology

## 2018-03-07 ENCOUNTER — Encounter: Payer: Self-pay | Admitting: Obstetrics and Gynecology

## 2018-03-07 ENCOUNTER — Ambulatory Visit (INDEPENDENT_AMBULATORY_CARE_PROVIDER_SITE_OTHER): Payer: 59 | Admitting: Obstetrics and Gynecology

## 2018-03-07 VITALS — BP 138/80 | HR 61 | Ht 61.5 in | Wt 140.0 lb

## 2018-03-07 DIAGNOSIS — J302 Other seasonal allergic rhinitis: Secondary | ICD-10-CM

## 2018-03-07 DIAGNOSIS — Z1239 Encounter for other screening for malignant neoplasm of breast: Secondary | ICD-10-CM

## 2018-03-07 DIAGNOSIS — Z01419 Encounter for gynecological examination (general) (routine) without abnormal findings: Secondary | ICD-10-CM | POA: Diagnosis not present

## 2018-03-07 DIAGNOSIS — Z0000 Encounter for general adult medical examination without abnormal findings: Secondary | ICD-10-CM

## 2018-03-07 DIAGNOSIS — A6004 Herpesviral vulvovaginitis: Secondary | ICD-10-CM

## 2018-03-07 DIAGNOSIS — M79604 Pain in right leg: Secondary | ICD-10-CM

## 2018-03-07 DIAGNOSIS — N952 Postmenopausal atrophic vaginitis: Secondary | ICD-10-CM

## 2018-03-07 DIAGNOSIS — Z124 Encounter for screening for malignant neoplasm of cervix: Secondary | ICD-10-CM

## 2018-03-07 MED ORDER — VALACYCLOVIR HCL 500 MG PO TABS: 500.0000 mg | ORAL_TABLET | Freq: Two times a day (BID) | ORAL | 1 refills | Status: DC

## 2018-03-07 MED ORDER — FLUTICASONE PROPIONATE 50 MCG/ACT NA SUSP: NASAL | 3 refills | Status: DC

## 2018-03-07 NOTE — Patient Instructions (Signed)
I value your feedback and entrusting us with your care. If you get a [facility] patient survey, I would appreciate you taking the time to let us know about your experience today. Thank you!  [facility]: [phone]    

## 2018-03-07 NOTE — Progress Notes (Signed)
PCP: Patient, No Pcp Per   Chief Complaint  Patient presents with  . Gynecologic Exam    gets throbbing pain in knee at times    HPI:      Kellie Willis is a 62 y.o. G1P1001 who LMP was No LMP recorded. Patient is postmenopausal., presents today for her annual examination.  Her menses are absent due to menopause. She does not have intermenstrual bleeding.  She does not have vasomotor sx.   Sex activity: single partner, contraception - post menopausal status. She does have vaginal dryness, uses estrace crm with sx relief. Doesn't need RF this yr.  Last Pap: 03/06/17  Results were: no abnormalities . Neg HPV DNA 2017. Pt likes yearly paps. Hx of STDs: HSV, HPV. She takes valtrex prn outbreaks, which are rare. Needs Rx RF this yr.  Last mammogram: 07/28/17 Results were: normal--routine follow-up in 12 months There is a FH of breast cancer in her PGM, genetic testing not indicated. There is no FH of ovarian cancer. The patient does not do self-breast exams.  Colonoscopy: colonoscopy 2018 without abnormalities.  Repeat due after 5 years due to [place] of poyps.  DEXA: 2012--osteopenia in hip.  Tobacco use: The patient denies current or previous tobacco use. Alcohol use: social Exercise: moderately active. Has been walking up hills and noticing RT knee/post hamstring/lower leg pain for about 3 wks. No pain if walking flat surfaces.  She does get adequate calcium and Vitamin D in her diet. Needs Rx RF on flonase for seasonal allergies.  Normal lipids 2017. Due for rechk 2020.  Past Medical History:  Diagnosis Date  . Allergy   . BV (bacterial vaginosis)   . 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 Diverticulosis   . Genital herpes 2016  . Glaucoma (increased eye pressure)   . Osteopenia     Past Surgical History:  Procedure Laterality Date  . COLONOSCOPY  2018   neg; repeat in 5 yrs due to [place] polyps  . CRYOTHERAPY  1979  . LIPOSUCTION    .  TONSILLECTOMY      Family History  Problem Relation Age of Onset  . Breast cancer Paternal Grandmother 29  . Hypertension Mother   . Cervical cancer Mother 68       no chemo  . Colon polyps Sister   . Hypertension Brother     Social History   Socioeconomic History  . Marital status: Divorced    Spouse name: Not on file  . Number of children: Not on file  . Years of education: Not on file  . Highest education level: Not on file  Occupational History  . Not on file  Social Needs  . Financial resource strain: Not on file  . Food insecurity:    Worry: Not on file    Inability: Not on file  . Transportation needs:    Medical: Not on file    Non-medical: Not on file  Tobacco Use  . Smoking status: Never Smoker  . Smokeless tobacco: Never Used  Substance and Sexual Activity  . Alcohol use: Yes    Comment: occasional  . Drug use: No  . Sexual activity: Yes    Birth control/protection: Post-menopausal  Lifestyle  . Physical activity:    Days per week: Not on file    Minutes per session: Not on file  . Stress: Not on file  Relationships  . Social connections:    Talks on  phone: Not on file    Gets together: Not on file    Attends religious service: Not on file    Active member of club or organization: Not on file    Attends meetings of clubs or organizations: Not on file    Relationship status: Not on file  . Intimate partner violence:    Fear of current or ex partner: Not on file    Emotionally abused: Not on file    Physically abused: Not on file    Forced sexual activity: Not on file  Other Topics Concern  . Not on file  Social History Narrative  . Not on file    Current Meds  Medication Sig  . cetirizine (ZYRTEC) 10 MG tablet Take 1 tablet (10 mg total) by mouth daily.  . COMBIGAN 0.2-0.5 % ophthalmic solution INSTILL 1 DROP BY OPHTHALMIC ROUTE EVERY 12 HOURS INTO RIGHT EYE  . estradiol (ESTRACE VAGINAL) 0.1 MG/GM vaginal cream Place vaginally.  .  fluticasone (FLONASE) 50 MCG/ACT nasal spray SPRAY 2 SPRAYS INTO EACH NOSTRIL EVERY DAY  . latanoprost (XALATAN) 0.005 % ophthalmic solution INSTILL 1 DROP INTO BOTH EYES EVERY DAY IN THE EVENING  . meloxicam (MOBIC) 15 MG tablet Take 1 tablet (15 mg total) by mouth daily.  . RESTASIS 0.05 % ophthalmic emulsion INSTILL 1 DROP BY OPHTHALMIC ROUTE EVERY 12 HOURS OU  . valACYclovir (VALTREX) 500 MG tablet Take 1 tablet (500 mg total) by mouth 2 (two) times daily for 3 days. Prn sx  . [DISCONTINUED] fluticasone (FLONASE) 50 MCG/ACT nasal spray SPRAY 2 SPRAYS INTO EACH NOSTRIL EVERY DAY  . [DISCONTINUED] valACYclovir (VALTREX) 1000 MG tablet TAKE 0.5 TABLETS (500 MG TOTAL) BY MOUTH 2 (TWO) TIMES DAILY. X 3 DAYS AS NEEDED FOR SYMPTOMS      ROS:  Review of Systems  Constitutional: Negative for fatigue, fever and unexpected weight change.  Respiratory: Negative for cough, shortness of breath and wheezing.   Cardiovascular: Negative for chest pain, palpitations and leg swelling.  Gastrointestinal: Negative for blood in stool, constipation, diarrhea, nausea and vomiting.  Endocrine: Negative for cold intolerance, heat intolerance and polyuria.  Genitourinary: Negative for dyspareunia, dysuria, flank pain, frequency, genital sores, hematuria, menstrual problem, pelvic pain, urgency, vaginal bleeding, vaginal discharge and vaginal pain.  Musculoskeletal: Positive for arthralgias and myalgias. Negative for back pain and joint swelling.  Skin: Negative for rash.  Neurological: Negative for dizziness, syncope, light-headedness, numbness and headaches.  Hematological: Negative for adenopathy.  Psychiatric/Behavioral: Negative for agitation, confusion, sleep disturbance and suicidal ideas. The patient is not nervous/anxious.     Objective: BP 138/80   Pulse 61   Ht 5' 1.5" (1.562 m)   Wt 140 lb (63.5 kg)   BMI 26.02 kg/m    Physical Exam  Constitutional: She is oriented to person, place, and  time. She appears well-developed and well-nourished.  Genitourinary: Vagina normal and uterus normal. There is no rash or tenderness on the right labia. There is no rash or tenderness on the left labia. No erythema or tenderness in the vagina. No vaginal discharge found. Right adnexum does not display mass and does not display tenderness. Left adnexum does not display mass and does not display tenderness. Cervix does not exhibit motion tenderness or polyp. Uterus is not enlarged or tender.  Neck: Normal range of motion. No thyromegaly present.  Cardiovascular: Normal rate, regular rhythm and normal heart sounds.  No murmur heard. Pulmonary/Chest: Effort normal and breath sounds normal. Right breast exhibits no  mass, no nipple discharge, no skin change and no tenderness. Left breast exhibits no mass, no nipple discharge, no skin change and no tenderness.  Abdominal: Soft. There is no tenderness. There is no guarding.  Musculoskeletal: Normal range of motion.  Neurological: She is alert and oriented to person, place, and time. No cranial nerve deficit.  Psychiatric: She has a normal mood and affect. Her behavior is normal.  Vitals reviewed.   Assessment/Plan:  Encounter for annual routine gynecological examination  Cervical cancer screening - Plan: Cytology - PAP  Screening for breast cancer - Pt to sched mammo 3/20 - Plan: MM 3D SCREEN BREAST BILATERAL  Vaginal atrophy - Doing well. Will call for estrace crm RF prn.  Herpes simplex vulvovaginitis - Rx RF valtrex. Takes prn. - Plan: valACYclovir (VALTREX) 500 MG tablet  Seasonal allergies - Rx RF flonase.  - Plan: fluticasone (FLONASE) 50 MCG/ACT nasal spray  Pain of right lower extremity - Most likely hamstring strain. Stretch/rest from walking hills. F/u prn.   Blood tests for routine general physical examination - Due 2020.   Meds ordered this encounter  Medications  . fluticasone (FLONASE) 50 MCG/ACT nasal spray    Sig: SPRAY 2  SPRAYS INTO EACH NOSTRIL EVERY DAY    Dispense:  16 g    Refill:  3    Order Specific Question:   Supervising Provider    Answer:   Gae Dry [id number]  . valACYclovir (VALTREX) 500 MG tablet    Sig: Take 1 tablet (500 mg total) by mouth 2 (two) times daily for 3 days. Prn sx    Dispense:  30 tablet    Refill:  1    Order Specific Question:   Supervising Provider    Answer:   Gae Dry [[id number]]            GYN counsel mammography screening, adequate intake of calcium and vitamin D, diet and exercise    F/U  Return in about 1 year (around 03/08/2019).  Kellie B. Copland, PA-C 03/07/2018 4:31 PM

## 2018-03-09 LAB — CYTOLOGY - PAP: DIAGNOSIS: NEGATIVE

## 2018-03-26 ENCOUNTER — Other Ambulatory Visit: Payer: Self-pay | Admitting: Obstetrics and Gynecology

## 2018-03-26 ENCOUNTER — Telehealth: Payer: Self-pay

## 2018-03-26 DIAGNOSIS — A6004 Herpesviral vulvovaginitis: Secondary | ICD-10-CM

## 2018-03-26 MED ORDER — VALACYCLOVIR HCL 500 MG PO TABS: 500.0000 mg | ORAL_TABLET | Freq: Two times a day (BID) | ORAL | 1 refills | Status: DC

## 2018-03-26 NOTE — Progress Notes (Signed)
Rx RF valtrex. Pt takes prn.

## 2018-03-26 NOTE — Telephone Encounter (Signed)
Pt aware. She said "you guys dont know how often I need them" when I told her the 60 tabs should last a long time. She will check with pharmacy.

## 2018-03-26 NOTE — Telephone Encounter (Signed)
Pt just had physical; is on valicyclovir and has only one refill; takes 1000mg  but now has 500mg  takes two.  She takes it as needed not daily.  [phone]

## 2018-03-26 NOTE — Telephone Encounter (Signed)
I gave her Rx with an additional RF at her annual, so 60 tabs total should last a long time. Also, just sent in more. She needs to check with pharm to see if they have Rx.

## 2018-03-26 NOTE — Telephone Encounter (Signed)
Please advise 

## 2018-03-26 NOTE — Telephone Encounter (Signed)
Pt aware, but also mentioned you normally give her a couple of refills throughout year that will last until annual?

## 2018-03-26 NOTE — Telephone Encounter (Signed)
Directions for valtrex as needed for symptoms are 500 mg BID for 3 days. She doesn't need 1000 mg BID. Will send in another RF but make sure pt is taking correctly. Thx.

## 2018-03-30 DIAGNOSIS — H18453 Nodular corneal degeneration, bilateral: Secondary | ICD-10-CM | POA: Diagnosis not present

## 2018-04-12 ENCOUNTER — Encounter: Payer: Self-pay | Admitting: Obstetrics and Gynecology

## 2018-04-13 DIAGNOSIS — M1711 Unilateral primary osteoarthritis, right knee: Secondary | ICD-10-CM | POA: Diagnosis not present

## 2018-04-18 DIAGNOSIS — M25569 Pain in unspecified knee: Secondary | ICD-10-CM | POA: Insufficient documentation

## 2018-04-25 ENCOUNTER — Encounter: Payer: Self-pay | Admitting: Obstetrics and Gynecology

## 2018-04-25 ENCOUNTER — Telehealth: Payer: Self-pay

## 2018-04-25 ENCOUNTER — Other Ambulatory Visit: Payer: Self-pay | Admitting: Obstetrics and Gynecology

## 2018-04-25 DIAGNOSIS — M1990 Unspecified osteoarthritis, unspecified site: Secondary | ICD-10-CM

## 2018-04-25 NOTE — Telephone Encounter (Signed)
Pt requesting a referral to Dr. Meda Coffee (? Sp) for arthritis.  [phone]

## 2018-04-25 NOTE — Telephone Encounter (Signed)
Please advise 

## 2018-04-25 NOTE — Telephone Encounter (Signed)
FYI

## 2018-04-25 NOTE — Progress Notes (Signed)
Ref to rheumatology for arthritis mgmt. Was seeing [facility].

## 2018-05-01 ENCOUNTER — Encounter: Payer: Self-pay | Admitting: Obstetrics and Gynecology

## 2018-05-04 ENCOUNTER — Ambulatory Visit: Payer: 59 | Admitting: Podiatry

## 2018-05-04 DIAGNOSIS — H18453 Nodular corneal degeneration, bilateral: Secondary | ICD-10-CM | POA: Diagnosis not present

## 2018-05-08 ENCOUNTER — Ambulatory Visit: Payer: 59 | Admitting: Podiatry

## 2018-05-08 ENCOUNTER — Encounter: Payer: Self-pay | Admitting: Podiatry

## 2018-05-08 DIAGNOSIS — G5762 Lesion of plantar nerve, left lower limb: Secondary | ICD-10-CM

## 2018-05-11 NOTE — Progress Notes (Signed)
   HPI: 63 year old female presenting today for follow up evaluation of a neuroma of the 3rd interspace of the left foot. She reports some relief from the injection she previously received. Walking for long periods of time causes numbness in the foot. She has not done anything at home for treatment. Patient is here for further evaluation and treatment.   Past Medical History:  Diagnosis Date  . Allergy   . BV (bacterial vaginosis)   . 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 Diverticulosis   . Genital herpes 2016  . Glaucoma (increased eye pressure)   . Osteopenia      Physical Exam: General: The patient is alert and oriented x3 in no acute distress.  Dermatology: Skin is warm, dry and supple bilateral lower extremities. Negative for open lesions or macerations.  Vascular: Palpable pedal pulses bilaterally. No edema or erythema noted. Capillary refill within normal limits.  Neurological: Epicritic and protective threshold grossly intact bilaterally.   Musculoskeletal Exam: Sharp pain with palpation of the 3rd interspace and lateral compression of the metatarsal heads consistent with neuroma.  Positive [place] Canal sign with loadbearing of the forefoot.  Assessment: 1.  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 Patient was evaluated. 2. Injection of 0.5 mLs Celestone Soluspan injected into the neuroma of the 3rd interspace of the left foot.  3. Recommended good shoe gear.  4. Return to clinic as needed.    Edrick Kins, DPM [facility]  Dr. Edrick Kins, [address]                                        [place], [place] [address]                Office [phone]  Fax [phone]

## 2018-05-14 ENCOUNTER — Ambulatory Visit: Payer: 59 | Admitting: Podiatry

## 2018-06-14 ENCOUNTER — Encounter: Payer: Self-pay | Admitting: Obstetrics and Gynecology

## 2018-06-14 NOTE — Telephone Encounter (Signed)
Pls update in chart. Thx.

## 2018-07-03 ENCOUNTER — Other Ambulatory Visit: Payer: Self-pay | Admitting: Obstetrics and Gynecology

## 2018-07-03 ENCOUNTER — Encounter: Payer: Self-pay | Admitting: Obstetrics and Gynecology

## 2018-07-03 DIAGNOSIS — J302 Other seasonal allergic rhinitis: Secondary | ICD-10-CM

## 2018-07-03 MED ORDER — FLUTICASONE PROPIONATE 50 MCG/ACT NA SUSP: NASAL | 1 refills | Status: DC

## 2018-07-03 NOTE — Progress Notes (Signed)
Rx RF flonase for 3 months at a time per ins.

## 2018-07-03 NOTE — Telephone Encounter (Signed)
Pls add shingles info in chart. Thx.

## 2018-10-12 ENCOUNTER — Ambulatory Visit
Admission: RE | Admit: 2018-10-12 | Discharge: 2018-10-12 | Disposition: A | Discharge status: Home / Self Care | Payer: 59 | Source: Ambulatory Visit | Attending: Obstetrics and Gynecology | Admitting: Obstetrics and Gynecology

## 2018-10-12 ENCOUNTER — Other Ambulatory Visit: Payer: Self-pay

## 2018-10-12 ENCOUNTER — Encounter: Payer: Self-pay | Admitting: Obstetrics and Gynecology

## 2018-10-12 DIAGNOSIS — Z1239 Encounter for other screening for malignant neoplasm of breast: Secondary | ICD-10-CM

## 2018-10-12 DIAGNOSIS — Z1231 Encounter for screening mammogram for malignant neoplasm of breast: Secondary | ICD-10-CM | POA: Insufficient documentation

## 2018-10-19 ENCOUNTER — Encounter: Payer: Self-pay | Admitting: Obstetrics and Gynecology

## 2018-11-06 ENCOUNTER — Ambulatory Visit: Payer: Self-pay | Admitting: Family Medicine

## 2018-11-08 ENCOUNTER — Other Ambulatory Visit: Payer: Self-pay | Admitting: Otolaryngology

## 2018-11-08 ENCOUNTER — Ambulatory Visit
Admission: RE | Admit: 2018-11-08 | Discharge: 2018-11-08 | Disposition: A | Discharge status: Home / Self Care | Payer: PRIVATE HEALTH INSURANCE | Source: Ambulatory Visit | Attending: Otolaryngology | Admitting: Otolaryngology

## 2018-11-08 DIAGNOSIS — R05 Cough: Secondary | ICD-10-CM

## 2018-11-08 DIAGNOSIS — R059 Cough, unspecified: Secondary | ICD-10-CM

## 2018-12-11 ENCOUNTER — Encounter: Payer: Self-pay | Admitting: Obstetrics and Gynecology

## 2018-12-18 NOTE — Progress Notes (Signed)
Kellie Willis, Deirdre Evener, PA-C   Chief Complaint  Patient presents with  . Vaginitis    irritation/itching x a few mos. Used OTC YI cream w/o relief     HPI:      Kellie Willis is a 63 y.o. G1P1001 who LMP was No LMP recorded. Patient is postmenopausal., presents today for vaginal itching/irritation intermittently at top of labia for a few months. Area feels rough. Has tried OTC yeast crm without relief. Pt was using different shower gels and now dial soap, no dryer sheet use. Hx of HSV and took valtrex without relief. No increased vag d/c, odor. Pt concerned.   Past Medical History:  Diagnosis Date  . Allergy   . BV (bacterial vaginosis)   . 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 Diverticulosis   . Genital herpes 2016  . Glaucoma (increased eye pressure)   . Osteopenia     Past Surgical History:  Procedure Laterality Date  . COLONOSCOPY  2018   neg; repeat in 5 yrs due to [place] polyps  . CRYOTHERAPY  1979  . LIPOSUCTION    . TONSILLECTOMY      Family History  Problem Relation Age of Onset  . Breast cancer Paternal Grandmother 75  . Hypertension Mother   . Cervical cancer Mother 28       no chemo  . Colon polyps Sister   . Hypertension Brother     Social History   Socioeconomic History  . Marital status: Divorced    Spouse name: Not on file  . Number of children: Not on file  . Years of education: Not on file  . Highest education level: Not on file  Occupational History  . Not on file  Social Needs  . Financial resource strain: Not on file  . Food insecurity    Worry: Not on file    Inability: Not on file  . Transportation needs    Medical: Not on file    Non-medical: Not on file  Tobacco Use  . Smoking status: Never Smoker  . Smokeless tobacco: Never Used  Substance and Sexual Activity  . Alcohol use: Yes    Comment: occasional  . Drug use: No  . Sexual activity: Yes    Birth control/protection: Post-menopausal   Lifestyle  . Physical activity    Days per week: Not on file    Minutes per session: Not on file  . Stress: Not on file  Relationships  . Social Herbalist on phone: Not on file    Gets together: Not on file    Attends religious service: Not on file    Active member of club or organization: Not on file    Attends meetings of clubs or organizations: Not on file    Relationship status: Not on file  . Intimate partner violence    Fear of current or ex partner: Not on file    Emotionally abused: Not on file    Physically abused: Not on file    Forced sexual activity: Not on file  Other Topics Concern  . Not on file  Social History Narrative  . Not on file    Outpatient Medications Prior to Visit  Medication Sig Dispense Refill  . azelastine (ASTELIN) 0.1 % nasal spray ONE SPRAY EACH NOSTRIL TWICE A DAY AS NEEDED FOR ALLERGIES    . cetirizine (ZYRTEC) 10 MG tablet Take 1 tablet (10 mg total)  by mouth daily. 30 tablet 11  . COMBIGAN 0.2-0.5 % ophthalmic solution INSTILL 1 DROP BY OPHTHALMIC ROUTE EVERY 12 HOURS INTO RIGHT EYE  5  . estradiol (ESTRACE VAGINAL) 0.1 MG/GM vaginal cream Place vaginally.    . fluticasone (FLONASE) 50 MCG/ACT nasal spray SPRAY 2 SPRAYS INTO EACH NOSTRIL EVERY DAY 48 g 1  . latanoprost (XALATAN) 0.005 % ophthalmic solution INSTILL 1 DROP INTO BOTH EYES EVERY DAY IN THE EVENING  5  . omeprazole (PRILOSEC) 20 MG capsule TAKE 30 MINUTES PRIOR TO FIRST MEAL OF DAY AS NEEDED FOR REFLUX    . RESTASIS 0.05 % ophthalmic emulsion INSTILL 1 DROP BY OPHTHALMIC ROUTE EVERY 12 HOURS OU  5  . meloxicam (MOBIC) 15 MG tablet Take 1 tablet (15 mg total) by mouth daily. 30 tablet 3   No facility-administered medications prior to visit.       ROS:  Review of Systems  Constitutional: Negative for fever.  Gastrointestinal: Negative for blood in stool, constipation, diarrhea, nausea and vomiting.  Genitourinary: Negative for dyspareunia, dysuria, flank pain,  frequency, hematuria, urgency, vaginal bleeding, vaginal discharge and vaginal pain.  Musculoskeletal: Negative for back pain.  Skin: Positive for rash.    OBJECTIVE:   Vitals:  BP 136/82 (BP Location: Left Arm, Patient Position: Sitting, Cuff Size: Normal)   Pulse 68   Ht 5' 1.5" (1.562 m)   Wt 138 lb (62.6 kg)   BMI 25.65 kg/m   Physical Exam Vitals signs reviewed.  Constitutional:      Appearance: She is well-developed.  Neck:     Musculoskeletal: Normal range of motion.  Pulmonary:     Effort: Pulmonary effort is normal.  Genitourinary:    Labia:        Right: Rash present.        Left: Rash present.     Musculoskeletal: Normal range of motion.  Skin:    General: Skin is warm and dry.  Neurological:     General: No focal deficit present.     Mental Status: She is alert and oriented to person, place, and time.     Cranial Nerves: No cranial nerve deficit.  Psychiatric:        Mood and Affect: Mood normal.        Behavior: Behavior normal.        Thought Content: Thought content normal.        Judgment: Judgment normal.     Assessment/Plan: Subacute vaginitis - Plan: clotrimazole-betamethasone (LOTRISONE) cream, Bilat labia majora, for several months. Looks fungal. Rx lotrisone crm BID for 2 wks. F/u for further eval/poss bx if sx persist after tx. Dove sens skin soap/water to wash. No evid of HPV or HSV.   Meds ordered this encounter  Medications  . clotrimazole-betamethasone (LOTRISONE) cream    Sig: Apply externally BID for 2 wks    Dispense:  15 g    Refill:  0    Order Specific Question:   Supervising Provider    Answer:   Gae Dry [[id number]]      Return if symptoms worsen or fail to improve.  Claxton Levitz B. Antwoin Lackey, PA-C 12/19/2018 3:55 PM

## 2018-12-19 ENCOUNTER — Other Ambulatory Visit: Payer: Self-pay

## 2018-12-19 ENCOUNTER — Ambulatory Visit (INDEPENDENT_AMBULATORY_CARE_PROVIDER_SITE_OTHER): Payer: 59 | Admitting: Obstetrics and Gynecology

## 2018-12-19 ENCOUNTER — Encounter: Payer: Self-pay | Admitting: Obstetrics and Gynecology

## 2018-12-19 VITALS — BP 136/82 | HR 68 | Ht 61.5 in | Wt 138.0 lb

## 2018-12-19 DIAGNOSIS — N761 Subacute and chronic vaginitis: Secondary | ICD-10-CM

## 2018-12-19 MED ORDER — CLOTRIMAZOLE-BETAMETHASONE 1-0.05 % EX CREA: TOPICAL_CREAM | CUTANEOUS | 0 refills | Status: DC

## 2018-12-19 NOTE — Patient Instructions (Signed)
I value your feedback and entrusting [place] with your care. If you get a [facility] patient survey, I would appreciate you taking the time to let us know about your experience today. Thank you!  HEALTHY VAGINAL HYGIENE  AVOID   Panytyhose Synthetic underwear (wear COTTON underwear)  Tight pants/jeans Thongs Pantyliners Scented soaps/shower gels (use Dove Sensitive Skin soap or water to clean) Bubble bath/bath bombs Scented detergents  ALL dryer sheets (line dry underwear if using them on your other clothing) Feminine sprays/douches

## 2019-01-08 DIAGNOSIS — G454 Transient global amnesia: Secondary | ICD-10-CM

## 2019-01-08 HISTORY — DX: Transient global amnesia: G45.4

## 2019-01-12 ENCOUNTER — Emergency Department: Payer: PRIVATE HEALTH INSURANCE

## 2019-01-12 ENCOUNTER — Inpatient Hospital Stay: Payer: PRIVATE HEALTH INSURANCE

## 2019-01-12 ENCOUNTER — Other Ambulatory Visit: Payer: Self-pay

## 2019-01-12 ENCOUNTER — Observation Stay
Admission: EM | Admit: 2019-01-12 | Discharge: 2019-01-13 | Disposition: A | Discharge status: Home / Self Care | Payer: PRIVATE HEALTH INSURANCE | Attending: Internal Medicine | Admitting: Internal Medicine

## 2019-01-12 ENCOUNTER — Encounter: Payer: Self-pay | Admitting: Emergency Medicine

## 2019-01-12 DIAGNOSIS — G454 Transient global amnesia: Secondary | ICD-10-CM | POA: Diagnosis not present

## 2019-01-12 DIAGNOSIS — G459 Transient cerebral ischemic attack, unspecified: Secondary | ICD-10-CM | POA: Diagnosis present

## 2019-01-12 DIAGNOSIS — Z20828 Contact with and (suspected) exposure to other viral communicable diseases: Secondary | ICD-10-CM | POA: Diagnosis not present

## 2019-01-12 DIAGNOSIS — K5790 Diverticulosis of intestine, part unspecified, without perforation or abscess without bleeding: Secondary | ICD-10-CM | POA: Insufficient documentation

## 2019-01-12 DIAGNOSIS — G43109 Migraine with aura, not intractable, without status migrainosus: Secondary | ICD-10-CM | POA: Diagnosis not present

## 2019-01-12 DIAGNOSIS — H409 Unspecified glaucoma: Secondary | ICD-10-CM | POA: Insufficient documentation

## 2019-01-12 DIAGNOSIS — Z79899 Other long term (current) drug therapy: Secondary | ICD-10-CM | POA: Diagnosis not present

## 2019-01-12 DIAGNOSIS — M8580 Other specified disorders of bone density and structure, unspecified site: Secondary | ICD-10-CM | POA: Diagnosis not present

## 2019-01-12 LAB — URINE DRUG SCREEN, QUALITATIVE (ARMC ONLY)
Amphetamines, Ur Screen: NOT DETECTED
Barbiturates, Ur Screen: NOT DETECTED
Benzodiazepine, Ur Scrn: NOT DETECTED
Cannabinoid 50 Ng, Ur ~~LOC~~: NOT DETECTED
Cocaine Metabolite,Ur ~~LOC~~: NOT DETECTED
MDMA (Ecstasy)Ur Screen: NOT DETECTED
Methadone Scn, Ur: NOT DETECTED
Opiate, Ur Screen: NOT DETECTED
Phencyclidine (PCP) Ur S: NOT DETECTED
Tricyclic, Ur Screen: NOT DETECTED

## 2019-01-12 LAB — SARS CORONAVIRUS 2 BY RT PCR (HOSPITAL ORDER, PERFORMED IN ~~LOC~~ HOSPITAL LAB): SARS Coronavirus 2: NEGATIVE

## 2019-01-12 LAB — HEMOGLOBIN A1C
Hgb A1c MFr Bld: 5.5 % (ref 4.8–5.6)
Mean Plasma Glucose: 111.15 mg/dL

## 2019-01-12 LAB — COMPREHENSIVE METABOLIC PANEL
ALT: 14 U/L (ref 0–44)
AST: 22 U/L (ref 15–41)
Albumin: 4.1 g/dL (ref 3.5–5.0)
Alkaline Phosphatase: 63 U/L (ref 38–126)
Anion gap: 7 (ref 5–15)
BUN: 26 mg/dL — ABNORMAL HIGH (ref 8–23)
CO2: 28 mmol/L (ref 22–32)
Calcium: 9.5 mg/dL (ref 8.9–10.3)
Chloride: 106 mmol/L (ref 98–111)
Creatinine, Ser: 0.85 mg/dL (ref 0.44–1.00)
GFR calc Af Amer: >60 mL/min (ref >60)
GFR calc non Af Amer: >60 mL/min (ref >60)
Glucose, Bld: 108 mg/dL — ABNORMAL HIGH (ref 70–99)
Potassium: 4.4 mmol/L (ref 3.5–5.1)
Sodium: 141 mmol/L (ref 135–145)
Total Bilirubin: 0.8 mg/dL (ref 0.3–1.2)
Total Protein: 7.5 g/dL (ref 6.5–8.1)

## 2019-01-12 LAB — URINALYSIS, ROUTINE W REFLEX MICROSCOPIC
Bilirubin Urine: NEGATIVE
Glucose, UA: NEGATIVE mg/dL
Hgb urine dipstick: NEGATIVE
Ketones, ur: NEGATIVE mg/dL
Leukocytes,Ua: NEGATIVE
Nitrite: NEGATIVE
Protein, ur: NEGATIVE mg/dL
Specific Gravity, Urine: 1.018 (ref 1.005–1.030)
pH: 6 (ref 5.0–8.0)

## 2019-01-12 LAB — LIPID PANEL
Cholesterol: 223 mg/dL — ABNORMAL HIGH (ref 0–200)
HDL: 59 mg/dL (ref >40)
LDL Cholesterol: 142 mg/dL — ABNORMAL HIGH (ref 0–99)
Total CHOL/HDL Ratio: 3.8 RATIO
Triglycerides: 110 mg/dL (ref <150)
VLDL: 22 mg/dL (ref 0–40)

## 2019-01-12 LAB — APTT: aPTT: 31 seconds (ref 24–36)

## 2019-01-12 LAB — CBC
HCT: 43.5 % (ref 36.0–46.0)
Hemoglobin: 14.5 g/dL (ref 12.0–15.0)
MCH: 32.6 pg (ref 26.0–34.0)
MCHC: 33.3 g/dL (ref 30.0–36.0)
MCV: 97.8 fL (ref 80.0–100.0)
Platelets: 256 10*3/uL (ref 150–400)
RBC: 4.45 MIL/uL (ref 3.87–5.11)
RDW: 11.4 % — ABNORMAL LOW (ref 11.5–15.5)
WBC: 7.3 10*3/uL (ref 4.0–10.5)
nRBC: 0 % (ref 0.0–0.2)

## 2019-01-12 LAB — DIFFERENTIAL
Abs Immature Granulocytes: 0.03 10*3/uL (ref 0.00–0.07)
Basophils Absolute: 0.1 10*3/uL (ref 0.0–0.1)
Basophils Relative: 1 %
Eosinophils Absolute: 0.2 10*3/uL (ref 0.0–0.5)
Eosinophils Relative: 3 %
Immature Granulocytes: 0 %
Lymphocytes Relative: 19 %
Lymphs Abs: 1.4 10*3/uL (ref 0.7–4.0)
Monocytes Absolute: 0.4 10*3/uL (ref 0.1–1.0)
Monocytes Relative: 6 %
Neutro Abs: 5.1 10*3/uL (ref 1.7–7.7)
Neutrophils Relative %: 71 %

## 2019-01-12 LAB — PROTIME-INR
INR: 1 (ref 0.8–1.2)
Prothrombin Time: 12.6 seconds (ref 11.4–15.2)

## 2019-01-12 LAB — GLUCOSE, CAPILLARY: Glucose-Capillary: 110 mg/dL — ABNORMAL HIGH (ref 70–99)

## 2019-01-12 LAB — ETHANOL: Alcohol, Ethyl (B): <10 mg/dL (ref <10)

## 2019-01-12 MED ORDER — LORATADINE 10 MG PO TABS: 10.0000 mg | ORAL_TABLET | Freq: Every day | ORAL | Status: DC

## 2019-01-12 MED ORDER — BRIMONIDINE TARTRATE-TIMOLOL 0.2-0.5 % OP SOLN: 1.0000 [drp] | Freq: Two times a day (BID) | OPHTHALMIC | Status: DC

## 2019-01-12 MED ORDER — ACETAMINOPHEN 650 MG RE SUPP: 650.0000 mg | RECTAL | Status: DC | PRN

## 2019-01-12 MED ORDER — PANTOPRAZOLE SODIUM 40 MG PO TBEC: 40.0000 mg | DELAYED_RELEASE_TABLET | Freq: Every day | ORAL | Status: DC

## 2019-01-12 MED ORDER — ASPIRIN 81 MG PO CHEW
324.0000 mg | CHEWABLE_TABLET | Freq: Once | ORAL | Status: AC
  Administered 2019-01-12: 13:00:00 324 mg via ORAL
  Filled 2019-01-12: qty 4

## 2019-01-12 MED ORDER — ASPIRIN EC 81 MG PO TBEC: 81.0000 mg | DELAYED_RELEASE_TABLET | Freq: Every day | ORAL | Status: DC

## 2019-01-12 MED ORDER — SENNOSIDES-DOCUSATE SODIUM 8.6-50 MG PO TABS: 1.0000 | ORAL_TABLET | Freq: Every evening | ORAL | Status: DC | PRN

## 2019-01-12 MED ORDER — CYCLOSPORINE 0.05 % OP EMUL
1.0000 [drp] | Freq: Two times a day (BID) | OPHTHALMIC | Status: DC
  Filled 2019-01-12 (×3): qty 30

## 2019-01-12 MED ORDER — ACETAMINOPHEN 325 MG PO TABS: 650.0000 mg | ORAL_TABLET | ORAL | Status: DC | PRN

## 2019-01-12 MED ORDER — ESTRADIOL 0.1 MG/GM VA CREA
1.0000 | TOPICAL_CREAM | VAGINAL | Status: DC
  Filled 2019-01-12: qty 42.5

## 2019-01-12 MED ORDER — AZELASTINE HCL 0.1 % NA SOLN
1.0000 | Freq: Two times a day (BID) | NASAL | Status: DC
  Filled 2019-01-12: qty 30

## 2019-01-12 MED ORDER — SODIUM CHLORIDE 0.9 % IV SOLN
INTRAVENOUS | Status: DC
  Administered 2019-01-12: 17:00:00 via INTRAVENOUS

## 2019-01-12 MED ORDER — TIMOLOL MALEATE 0.5 % OP SOLN
1.0000 [drp] | Freq: Two times a day (BID) | OPHTHALMIC | Status: DC
  Filled 2019-01-12: qty 5

## 2019-01-12 MED ORDER — LATANOPROST 0.005 % OP SOLN
1.0000 [drp] | Freq: Every day | OPHTHALMIC | Status: DC
  Filled 2019-01-12: qty 2.5

## 2019-01-12 MED ORDER — STROKE: EARLY STAGES OF RECOVERY BOOK
Freq: Once | Status: AC
  Administered 2019-01-12: 17:00:00

## 2019-01-12 MED ORDER — BRIMONIDINE TARTRATE 0.2 % OP SOLN
1.0000 [drp] | Freq: Two times a day (BID) | OPHTHALMIC | Status: DC
  Filled 2019-01-12: qty 5

## 2019-01-12 MED ORDER — ACETAMINOPHEN 160 MG/5ML PO SOLN
650.0000 mg | ORAL | Status: DC | PRN
  Filled 2019-01-12: qty 20.3

## 2019-01-12 MED ORDER — ENOXAPARIN SODIUM 40 MG/0.4ML ~~LOC~~ SOLN: 40.0000 mg | SUBCUTANEOUS | Status: DC

## 2019-01-12 NOTE — ED Notes (Signed)
Pt up to use bathroom 

## 2019-01-12 NOTE — ED Notes (Signed)
ED TO INPATIENT HANDOFF REPORT  ED Nurse Name and Phone #: Dionisio David Name/Age/Gender Kellie Willis A Busbin 63 y.o. female Room/Bed: ED05A/ED05A  Code Status   Code Status: Full Code  Home/SNF/Other Home Patient oriented to: self, place, time and situation Is this baseline? Yes   Triage Complete: Triage complete  Chief Complaint memory loss  Triage Note Per husband patient has exhibited trouble remembering things x past 3 hours. Face symmetrical. Grips equal. Speech clear.    Allergies No Known Allergies  Level of Care/Admitting Diagnosis ED Disposition    ED Disposition Condition [facility] Area: [place] [100120]  Level of Care: Med-Surg [16]  Covid Evaluation: Asymptomatic Screening Protocol (No Symptoms)  Diagnosis: TIA (transient ischemic attackPP:800902  Admitting Physician: Rufina Falco [facility] [id number]  Attending Physician: Rufina Falco ACHIENG [id number]  Estimated length of stay: past midnight tomorrow  Certification:: I certify this patient will need inpatient services for at least 2 midnights  PT Class (Do Not Modify): Inpatient [101]  PT Acc Code (Do Not Modify): Private [1]       B Medical/Surgery History Past Medical History:  Diagnosis Date  . Allergy   . BV (bacterial vaginosis)   . 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 Diverticulosis   . Genital herpes 2016  . Glaucoma (increased eye pressure)   . Osteopenia    Past Surgical History:  Procedure Laterality Date  . COLONOSCOPY  2018   neg; repeat in 5 yrs due to [place] polyps  . CRYOTHERAPY  1979  . LIPOSUCTION    . TONSILLECTOMY       A IV Location/Drains/Wounds Patient Lines/Drains/Airways Status   Active Line/Drains/Airways    None          Intake/Output Last 24 hours No intake or output data in the 24 hours ending 01/12/19 1543  Labs/Imaging Results for orders placed or performed during the hospital  encounter of 01/12/19 (from the past 48 hour(s))  Ethanol     Status: None   Collection Time: 01/12/19 11:32 AM  Result Value Ref Range   Alcohol, Ethyl (B) <10 <10 mg/dL    Comment: (NOTE) Lowest detectable limit for serum alcohol is 10 mg/dL. For medical purposes only. Performed at [facility], [address] [place], [place] [address]   Protime-INR     Status: None   Collection Time: 01/12/19 11:32 AM  Result Value Ref Range   Prothrombin Time 12.6 11.4 - 15.2 seconds   INR 1.0 0.8 - 1.2    Comment: (NOTE) INR goal varies based on device and disease states. Performed at [facility], [address] [place], [place] [address]   APTT     Status: None   Collection Time: 01/12/19 11:32 AM  Result Value Ref Range   aPTT 31 24 - 36 seconds    Comment: Performed at [facility], [address] [place], [place] [address]  CBC     Status: Abnormal   Collection Time: 01/12/19 11:32 AM  Result Value Ref Range   WBC 7.3 4.0 - 10.5 K/uL   RBC 4.45 3.87 - 5.11 MIL/uL   Hemoglobin 14.5 12.0 - 15.0 g/dL   HCT 43.5 36.0 - 46.0 %   MCV 97.8 80.0 - 100.0 fL   MCH 32.6 26.0 - 34.0 pg   MCHC 33.3 30.0 - 36.0 g/dL   RDW 11.4 (L) 11.5 - 15.5 %  Platelets 256 150 - 400 K/uL   nRBC 0.0 0.0 - 0.2 %    Comment: Performed at [facility], [address] [place], [place] [address]  Differential     Status: None   Collection Time: 01/12/19 11:32 AM  Result Value Ref Range   Neutrophils Relative % 71 %   Neutro Abs 5.1 1.7 - 7.7 K/uL   Lymphocytes Relative 19 %   Lymphs Abs 1.4 0.7 - 4.0 K/uL   Monocytes Relative 6 %   Monocytes Absolute 0.4 0.1 - 1.0 K/uL   Eosinophils Relative 3 %   Eosinophils Absolute 0.2 0.0 - 0.5 K/uL   Basophils Relative 1 %   Basophils Absolute 0.1 0.0 - 0.1 K/uL   Immature Granulocytes 0 %   Abs Immature Granulocytes 0.03 0.00 - 0.07 K/uL    Comment: Performed at [facility], [address]  [place], [place] [address]  Comprehensive metabolic panel     Status: Abnormal   Collection Time: 01/12/19 11:32 AM  Result Value Ref Range   Sodium 141 135 - 145 mmol/L   Potassium 4.4 3.5 - 5.1 mmol/L    Comment: HEMOLYSIS AT THIS LEVEL MAY AFFECT RESULT   Chloride 106 98 - 111 mmol/L   CO2 28 22 - 32 mmol/L   Glucose, Bld 108 (H) 70 - 99 mg/dL   BUN 26 (H) 8 - 23 mg/dL   Creatinine, Ser 0.85 0.44 - 1.00 mg/dL   Calcium 9.5 8.9 - 10.3 mg/dL   Total Protein 7.5 6.5 - 8.1 g/dL   Albumin 4.1 3.5 - 5.0 g/dL   AST 22 15 - 41 U/L   ALT 14 0 - 44 U/L   Alkaline Phosphatase 63 38 - 126 U/L   Total Bilirubin 0.8 0.3 - 1.2 mg/dL   GFR calc non Af Amer >60 >60 mL/min   GFR calc Af Amer >60 >60 mL/min   Anion gap 7 5 - 15    Comment: Performed at [facility], [address] [place], [place] [address]  Lipid panel     Status: Abnormal   Collection Time: 01/12/19 11:32 AM  Result Value Ref Range   Cholesterol 223 (H) 0 - 200 mg/dL   Triglycerides 110 <150 mg/dL   HDL 59 >40 mg/dL   Total CHOL/HDL Ratio 3.8 RATIO   VLDL 22 0 - 40 mg/dL   LDL Cholesterol 142 (H) 0 - 99 mg/dL    Comment:        Total Cholesterol/HDL:CHD Risk Coronary Heart Disease Risk Table                     Men   Women  1/2 Average Risk   3.4   3.3  Average Risk       5.0   4.4  2 X Average Risk   9.6   7.1  3 X Average Risk  23.4   11.0        Use the calculated Patient Ratio above and the CHD Risk Table to determine the patient's CHD Risk.        ATP III CLASSIFICATION (LDL):  <100     mg/dL   Optimal  100-129  mg/dL   Near or Above                    Optimal  130-159  mg/dL   Borderline  160-189  mg/dL   High  >190  mg/dL   Very High Performed at [facility], [address] [place], [place] [address]   Glucose, capillary     Status: Abnormal   Collection Time: 01/12/19 11:53 AM  Result Value Ref Range   Glucose-Capillary 110 (H) 70 - 99 mg/dL  Urine Drug Screen, Qualitative      Status: None   Collection Time: 01/12/19 12:15 PM  Result Value Ref Range   Tricyclic, Ur Screen NONE DETECTED NONE DETECTED   Amphetamines, Ur Screen NONE DETECTED NONE DETECTED   MDMA (Ecstasy)Ur Screen NONE DETECTED NONE DETECTED   Cocaine Metabolite,Ur [place] NONE DETECTED NONE DETECTED   Opiate, Ur Screen NONE DETECTED NONE DETECTED   Phencyclidine (PCP) Ur S NONE DETECTED NONE DETECTED   Cannabinoid 50 Ng, Ur [place] NONE DETECTED NONE DETECTED   Barbiturates, Ur Screen NONE DETECTED NONE DETECTED   Benzodiazepine, Ur Scrn NONE DETECTED NONE DETECTED   Methadone Scn, Ur NONE DETECTED NONE DETECTED    Comment: (NOTE) Tricyclics + metabolites, urine    Cutoff 1000 ng/mL Amphetamines + metabolites, urine  Cutoff 1000 ng/mL MDMA (Ecstasy), urine              Cutoff 500 ng/mL Cocaine Metabolite, urine          Cutoff 300 ng/mL Opiate + metabolites, urine        Cutoff 300 ng/mL Phencyclidine (PCP), urine         Cutoff 25 ng/mL Cannabinoid, urine                 Cutoff 50 ng/mL Barbiturates + metabolites, urine  Cutoff 200 ng/mL Benzodiazepine, urine              Cutoff 200 ng/mL Methadone, urine                   Cutoff 300 ng/mL The urine drug screen provides only a preliminary, unconfirmed analytical test result and should not be used for non-medical purposes. Clinical consideration and professional judgment should be applied to any positive drug screen result due to possible interfering substances. A more specific alternate chemical method must be used in order to obtain a confirmed analytical result. Gas chromatography / mass spectrometry (GC/MS) is the preferred confirmat ory method. Performed at [facility], [address] [place], [place] [address]   Urinalysis, Routine w reflex microscopic     Status: Abnormal   Collection Time: 01/12/19 12:15 PM  Result Value Ref Range   Color, Urine YELLOW (A) YELLOW   APPearance CLEAR (A) CLEAR   Specific Gravity, Urine 1.018  1.005 - 1.030   pH 6.0 5.0 - 8.0   Glucose, UA NEGATIVE NEGATIVE mg/dL   Hgb urine dipstick NEGATIVE NEGATIVE   Bilirubin Urine NEGATIVE NEGATIVE   Ketones, ur NEGATIVE NEGATIVE mg/dL   Protein, ur NEGATIVE NEGATIVE mg/dL   Nitrite NEGATIVE NEGATIVE   Leukocytes,Ua NEGATIVE NEGATIVE    Comment: Performed at [facility], [address] [place],  [address]  SARS Coronavirus 2 [facility] order, Performed in [facility] hospital lab) Nasopharyngeal Nasopharyngeal Swab     Status: None   Collection Time: 01/12/19 12:39 PM   Specimen: Nasopharyngeal Swab  Result Value Ref Range   SARS Coronavirus 2 NEGATIVE NEGATIVE    Comment: (NOTE) If result is NEGATIVE SARS-CoV-2 target nucleic acids are NOT DETECTED. The SARS-CoV-2 RNA is generally detectable in upper and lower  respiratory specimens during the acute phase of infection. The lowest  concentration of  SARS-CoV-2 viral copies this assay can detect is 250  copies / mL. A negative result does not preclude SARS-CoV-2 infection  and should not be used as the sole basis for treatment or other  patient management decisions.  A negative result may occur with  improper specimen collection / handling, submission of specimen other  than nasopharyngeal swab, presence of viral mutation(s) within the  areas targeted by this assay, and inadequate number of viral copies  (<250 copies / mL). A negative result must be combined with clinical  observations, patient history, and epidemiological information. If result is POSITIVE SARS-CoV-2 target nucleic acids are DETECTED. The SARS-CoV-2 RNA is generally detectable in upper and lower  respiratory specimens dur ing the acute phase of infection.  Positive  results are indicative of active infection with SARS-CoV-2.  Clinical  correlation with patient history and other diagnostic information is  necessary to determine patient infection status.  Positive results do  not rule out bacterial  infection or co-infection with other viruses. If result is PRESUMPTIVE POSTIVE SARS-CoV-2 nucleic acids MAY BE PRESENT.   A presumptive positive result was obtained on the submitted specimen  and confirmed on repeat testing.  While 2019 novel coronavirus  (SARS-CoV-2) nucleic acids may be present in the submitted sample  additional confirmatory testing may be necessary for epidemiological  and / or clinical management purposes  to differentiate between  SARS-CoV-2 and other Sarbecovirus currently known to infect humans.  If clinically indicated additional testing with an alternate test  methodology [phone]) is advised. The SARS-CoV-2 RNA is generally  detectable in upper and lower respiratory sp ecimens during the acute  phase of infection. The expected result is Negative. Fact Sheet for Patients:  StrictlyIdeas.no Fact Sheet for Healthcare Providers: BankingDealers.co.za This test is not yet approved or cleared by the [place] FDA and has been authorized for detection and/or diagnosis of SARS-CoV-2 by FDA under an Emergency Use Authorization (EUA).  This EUA will remain in effect (meaning this test can be used) for the duration of the COVID-19 declaration under Section 564(b)(1) of the Act, 21 U.S.C. section 360bbb-3(b)(1), unless the authorization is terminated or revoked sooner. Performed at [facility], [address] [place], [place] [address]    Ct Head Code Stroke Wo Contrast  Result Date: 01/12/2019 CLINICAL DATA:  Code stroke.  Memory difficulty for 3 hours EXAM: CT HEAD WITHOUT CONTRAST TECHNIQUE: Contiguous axial images were obtained from the base of the skull through the vertex without intravenous contrast. COMPARISON:  None. FINDINGS: Brain: No evidence of acute infarction, hemorrhage, hydrocephalus, extra-axial collection or mass lesion/mass effect. Vascular: No hyperdense vessel or unexpected calcification.  Skull: Normal. Negative for fracture or focal lesion. Sinuses/Orbits: No acute finding. Other: These results were called by telephone at the time of interpretation on 01/12/2019 at 11:27 am to Dr. Joni Fears , who verbally acknowledged these results. ASPECTS [facility] Stroke Program Early CT Score) Not scored with this history IMPRESSION: Negative study. Electronically Signed   By: Monte Fantasia M.D.   On: 01/12/2019 11:28    Pending Labs Unresulted Labs (From admission, onward)    Start     Ordered   01/12/19 1630  HIV Antibody (routine testing w rflx)  Once,   R     01/12/19 1630   01/12/19 1422  Hemoglobin A1c  Add-on,   AD     01/12/19 1422          Vitals/Pain Today's Vitals   01/12/19 1300 01/12/19 1330  01/12/19 1400 01/12/19 1430  BP: (!) 146/79 (!) 149/75 (!) 160/87 120/77  Pulse: (!) 58 60  67  Resp: 18 14  14   Temp:      TempSrc:      SpO2: 98% 100% 100% 98%  PainSc:        Isolation Precautions No active isolations  Medications Medications  pantoprazole (PROTONIX) EC tablet 40 mg (has no administration in time range)  estradiol (ESTRACE) vaginal cream 1 Applicatorful (has no administration in time range)  azelastine (ASTELIN) 0.1 % nasal spray 1 spray (has no administration in time range)  loratadine (CLARITIN) tablet 10 mg (has no administration in time range)  brimonidine-timolol (COMBIGAN) 0.2-0.5 % ophthalmic solution 1 drop (has no administration in time range)  latanoprost (XALATAN) 0.005 % ophthalmic solution 1 drop (has no administration in time range)  cycloSPORINE (RESTASIS) 0.05 % ophthalmic emulsion 1 drop (has no administration in time range)   stroke: mapping our early stages of recovery book (has no administration in time range)  0.9 %  sodium chloride infusion (has no administration in time range)  acetaminophen (TYLENOL) tablet 650 mg (has no administration in time range)    Or  acetaminophen (TYLENOL) solution 650 mg (has no administration in time  range)    Or  acetaminophen (TYLENOL) suppository 650 mg (has no administration in time range)  senna-docusate (Senokot-S) tablet 1 tablet (has no administration in time range)  aspirin EC tablet 81 mg (has no administration in time range)  aspirin chewable tablet 324 mg (324 mg Oral Given 01/12/19 1238)    Mobility walks     Focused Assessments Neuro Assessment Handoff:  Swallow screen pass? Yes    NIH Stroke Scale ( + Modified Stroke Scale Criteria)  Interval: Initial Level of Consciousness (1a.)   : Alert, keenly responsive LOC Questions (1b. )   +: Answers both questions correctly LOC Commands (1c. )   + : Performs both tasks correctly Best Gaze (2. )  +: Normal Visual (3. )  +: No visual loss Facial Palsy (4. )    : Normal symmetrical movements Motor Arm, Left (5a. )   +: No drift Motor Arm, Right (5b. )   +: No drift Motor Leg, Left (6a. )   +: No drift Motor Leg, Right (6b. )   +: No drift Limb Ataxia (7. ): Absent Sensory (8. )   +: Normal, no sensory loss Best Language (9. )   +: No aphasia Dysarthria (10. ): Normal Extinction/Inattention (11.)   +: No Abnormality Modified SS Total  +: 0 Complete NIHSS TOTAL: 0 Last date known well: 01/12/19 Last time known well: 0830 Neuro Assessment:   Neuro Checks:   Initial (01/12/19 1154)  Last Documented NIHSS Modified Score: 0 (01/12/19 1154) Has TPA been given? No If patient is a Neuro Trauma and patient is going to OR before floor call report to [place] nurse: [phone] or [phone]     R Recommendations: See Admitting Provider Note  Report given to:   Additional Notes:

## 2019-01-12 NOTE — ED Notes (Signed)
Admitting Dr at bedside

## 2019-01-12 NOTE — ED Provider Notes (Signed)
[facility] Emergency Department Provider Note  ____________________________________________  Time seen: Approximately 1:55 PM  I have reviewed the triage vital signs and the nursing notes.   HISTORY  Chief Complaint Memory Loss    HPI Kellie Willis is a 63 y.o. female with a history of diverticulosis, glaucoma who comes the ED complaining of  memory problems that started at about 8:20 AM today.  She was in her baseline state of health prior to that.  She was asking her fianc repeated questions, having trouble remembering events of yesterday.  No trauma fevers chills neck pain or other acute symptoms.  Does have a personal history of migraine headache with aura which she did not experience today.  Also notes that she is been feeling very stressed lately due to her dog that she has had for 15 years becoming terminally ill.     Past Medical History:  Diagnosis Date  . Allergy   . BV (bacterial vaginosis)   . 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 Diverticulosis   . Genital herpes 2016  . Glaucoma (increased eye pressure)   . Osteopenia      Patient Active Problem List   Diagnosis Date Noted  . Seasonal allergies 03/06/2017  . Allergic rhinitis 08/10/2015     Past Surgical History:  Procedure Laterality Date  . COLONOSCOPY  2018   neg; repeat in 5 yrs due to [place] polyps  . CRYOTHERAPY  1979  . LIPOSUCTION    . TONSILLECTOMY       Prior to Admission medications   Medication Sig Start Date End Date Taking? Authorizing Provider  azelastine (ASTELIN) 0.1 % nasal spray ONE SPRAY EACH NOSTRIL TWICE A DAY AS NEEDED FOR ALLERGIES 11/08/18  Yes [provider]  cetirizine (ZYRTEC) 10 MG tablet Take 1 tablet (10 mg total) by mouth daily. 08/10/15  Yes Krebs, Amy Lauren, NP  COMBIGAN 0.2-0.5 % ophthalmic solution INSTILL 1 DROP BY OPHTHALMIC ROUTE EVERY 12 HOURS INTO RIGHT EYE 02/15/17  Yes [provider]   estradiol (ESTRACE VAGINAL) 0.1 MG/GM vaginal cream Place 1 Applicatorful vaginally once a week.    Yes [provider]  latanoprost (XALATAN) 0.005 % ophthalmic solution INSTILL 1 DROP INTO BOTH EYES EVERY DAY IN THE EVENING 02/15/17  Yes [provider]  omeprazole (PRILOSEC) 20 MG capsule TAKE 30 MINUTES PRIOR TO FIRST MEAL OF DAY AS NEEDED FOR REFLUX 11/08/18  Yes [provider]  RESTASIS 0.05 % ophthalmic emulsion INSTILL 1 DROP BY OPHTHALMIC ROUTE EVERY 12 HOURS OU 03/03/17  Yes [provider]     Allergies Patient has no known allergies.   Family History  Problem Relation Age of Onset  . Breast cancer Paternal Grandmother 64  . Hypertension Mother   . Cervical cancer Mother 81       no chemo  . Colon polyps Sister   . Hypertension Brother     Social History Social History   Tobacco Use  . Smoking status: Never Smoker  . Smokeless tobacco: Never Used  Substance Use Topics  . Alcohol use: Yes    Comment: occasional  . Drug use: No    Review of Systems  Constitutional:   No fever or chills.  ENT:   No sore throat. No rhinorrhea. Cardiovascular:   No chest pain or syncope. Respiratory:   No dyspnea or cough. Gastrointestinal:   Negative for abdominal pain, vomiting and diarrhea.  Musculoskeletal:  Negative for focal pain or swelling All other systems reviewed and are negative except as documented above in ROS and HPI.  ____________________________________________   PHYSICAL EXAM:  VITAL SIGNS: ED Triage Vitals  Enc Vitals Group     BP 01/12/19 1112 (!) 156/79     Pulse Rate 01/12/19 1112 (!) 106     Resp 01/12/19 1112 20     Temp 01/12/19 1112 97.9 F (36.6 C)     Temp Source 01/12/19 1112 Oral     SpO2 01/12/19 1112 100 %     Weight --      Height --      Head Circumference --      Peak Flow --      Pain Score 01/12/19 1113 0     Pain Loc --      Pain Edu? --      Excl. in [place]? --     Vital signs reviewed,  nursing assessments reviewed.   Constitutional:   Alert and oriented. Non-toxic appearance. Eyes:   Conjunctivae are normal. EOMI. PERRL. ENT      Head:   Normocephalic and atraumatic.        Neck:   No meningismus. Full ROM. Hematological/Lymphatic/Immunilogical:   No cervical lymphadenopathy. Cardiovascular:   RRR. Cap refill less than 2 seconds. Respiratory:   Normal respiratory effort without tachypnea/retractions.  Musculoskeletal:   Normal range of motion in all extremities. No joint effusions.  No lower extremity tenderness.  No edema. Neurologic:   Normal speech and language.  Cranial nerves III through XII intact Motor grossly intact. No acute focal neurologic deficits are appreciated.  NIH stroke scale 0 Skin:    Skin is warm, dry and intact. No rash noted.  No petechiae, purpura, or bullae.  ____________________________________________    LABS (pertinent positives/negatives) (all labs ordered are listed, but only abnormal results are displayed) Labs Reviewed  CBC - Abnormal; Notable for the following components:      Result Value   RDW 11.4 (*)    All other components within normal limits  COMPREHENSIVE METABOLIC PANEL - Abnormal; Notable for the following components:   Glucose, Bld 108 (*)    BUN 26 (*)    All other components within normal limits  URINALYSIS, ROUTINE W REFLEX MICROSCOPIC - Abnormal; Notable for the following components:   Color, Urine YELLOW (*)    APPearance CLEAR (*)    All other components within normal limits  GLUCOSE, CAPILLARY - Abnormal; Notable for the following components:   Glucose-Capillary 110 (*)    All other components within normal limits  SARS CORONAVIRUS 2 (HOSPITAL ORDER, [place] LAB)  ETHANOL  PROTIME-INR  APTT  DIFFERENTIAL  URINE DRUG SCREEN, QUALITATIVE ([facility] ONLY)   ____________________________________________   EKG  Interpreted by me Sinus rhythm rate of 71, normal axis intervals QRS  ST segments and T waves  ____________________________________________    RADIOLOGY  Ct Head Code Stroke Wo Contrast  Result Date: 01/12/2019 CLINICAL DATA:  Code stroke.  Memory difficulty for 3 hours EXAM: CT HEAD WITHOUT CONTRAST TECHNIQUE: Contiguous axial images were obtained from the base of the skull through the vertex without intravenous contrast. COMPARISON:  None. FINDINGS: Brain: No evidence of acute infarction, hemorrhage, hydrocephalus, extra-axial collection or mass lesion/mass effect. Vascular: No hyperdense vessel or unexpected calcification. Skull: Normal. Negative for fracture or focal lesion. Sinuses/Orbits: No acute finding. Other: These results were called by telephone at the time of interpretation on 01/12/2019  at 11:27 am to Dr. Joni Fears , who verbally acknowledged these results. ASPECTS [facility] Stroke Program Early CT Score) Not scored with this history IMPRESSION: Negative study. Electronically Signed   By: Monte Fantasia M.D.   On: 01/12/2019 11:28    ____________________________________________   PROCEDURES Procedures  ____________________________________________  DIFFERENTIAL DIAGNOSIS   TIA, complex migraine, stress  CLINICAL IMPRESSION / ASSESSMENT AND PLAN / ED COURSE  Medications ordered in the ED: Medications  aspirin chewable tablet 324 mg (324 mg Oral Given 01/12/19 1238)    Pertinent labs & imaging results that were available during my care of the patient were reviewed by me and considered in my medical decision making (see chart for details).  Caraline A Laham was evaluated in Emergency Department on 01/12/2019 for the symptoms described in the history of present illness. She was evaluated in the context of the global COVID-19 pandemic, which necessitated consideration that the patient might be at risk for infection with the SARS-CoV-2 virus that causes COVID-19. Institutional protocols and algorithms that pertain to the evaluation of patients at risk for  COVID-19 are in a state of rapid change based on information released by regulatory bodies including the CDC and federal and state organizations. These policies and algorithms were followed during the patient's care in the ED.     Clinical Course as of Jan 11 1354  Sat Jan 12, 2019  1131 Patient presents with rapid onset of vague malaise and disorientation, trouble with memory recall from yesterday.  CT head discussed with radiologist, normal.  Stroke neurologist on screen for telemedicine evaluation.   [PS]  1213 Case discussed with neurologist who is worried about a TIA.  Agrees with additional aspirin.  Recommends hospitalization for further stroke work-up to which the patient has agreed.   [PS]    Clinical Course User Index [PS] Carrie Mew, MD     ____________________________________________   FINAL CLINICAL IMPRESSION(S) / ED DIAGNOSES    Final diagnoses:  TIA (transient ischemic attack)     ED Discharge Orders    None      Portions of this note were generated with dragon dictation software. Dictation errors may occur despite best attempts at proofreading.   Carrie Mew, MD 01/12/19 [phone]

## 2019-01-12 NOTE — H&P (Addendum)
[facility] [place] NAME: Kellie Willis    MR#:  [id number]  DATE OF BIRTH:  [birth year]  DATE OF ADMISSION:  01/12/2019  PRIMARY CARE PHYSICIAN: Chad Cordial, PA-C   REQUESTING/REFERRING PHYSICIAN: Brenton Grills, MD  CHIEF COMPLAINT:   Chief Complaint  Patient presents with  . Memory Loss   HISTORY OF PRESENT ILLNESS:   63 year old female with past medical history of migraine with aura, glaucoma, allergic rhinitis, and H. pylori presenting to the ED with chief complaints of transient memory loss.  Patient does not recall the event therefore history mostly obtained from patient's husband who is currently at the bedside.  Patient's husband states that he woke up this morning as usual and was going about the usual monitoring when suddenly at 8:30am he noticed that patient was behaving and acting abnormally. She was having difficulty remembering recent events and asking the same questions over and over.  Husband states has had intermittent episodes of forgetfulness but this time seems to have been worse than usual. Denies associated cranial nerve deficit, seizures, focal motor or sensory deficits, diplopia, nausea or vomiting, dizziness, syncope or LOC, paresthesia (numbness, tingling, pins-and-needles sensation) or a heavy feeling in an extremity.   On arrival to the ED, he was afebrile with blood pressure 156/76 mm Hg and pulse rate 106 beats/min. There were no focal neurological deficits; he was alert and oriented x4, and he did not demonstrate any memory deficits. Initial NIH stroke scale 0. Since patient was within the window period for IV alteplase, code stroke was initiated and patient evaluated by a tele-neurologist.  A noncontrast CT head was obtained which showed no acute intracranial abnormality. Per Neurology, clinical presentation was not suggestive of large vessel occlusive disease therefore patient was not a candidate for  thrombectomy.  She was also not deemed candidate for tPA thrombolytics because of resolved symptoms with no residual disabling symptoms. Initial labs showed unremarkable CBC and CMP.  Urinalysis and UDS also negative.  Patient will be admitted for full stroke work-up and management.  PAST MEDICAL HISTORY:   Past Medical History:  Diagnosis Date  . Allergy   . BV (bacterial vaginosis)   . 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 Diverticulosis   . Genital herpes 2016  . Glaucoma (increased eye pressure)   . Osteopenia     PAST SURGICAL HISTORY:   Past Surgical History:  Procedure Laterality Date  . COLONOSCOPY  2018   neg; repeat in 5 yrs due to [place] polyps  . CRYOTHERAPY  1979  . LIPOSUCTION    . TONSILLECTOMY      SOCIAL HISTORY:   Social History   Tobacco Use  . Smoking status: Never Smoker  . Smokeless tobacco: Never Used  Substance Use Topics  . Alcohol use: Yes    Comment: occasional    FAMILY HISTORY:   Family History  Problem Relation Age of Onset  . Breast cancer Paternal Grandmother 84  . Hypertension Mother   . Cervical cancer Mother 68       no chemo  . Colon polyps Sister   . Hypertension Brother     DRUG ALLERGIES:  No Known Allergies  REVIEW OF SYSTEMS:   Review of Systems  Constitutional: Negative for chills, fever, malaise/fatigue and weight loss.  HENT: Negative for congestion, hearing loss and sore throat.   Eyes: Negative for blurred vision and double vision.  Vision changes from Glaucoma  Respiratory: Negative for cough, shortness of breath and wheezing.   Cardiovascular: Negative for chest pain, palpitations, orthopnea and leg swelling.  Gastrointestinal: Negative for abdominal pain, diarrhea, nausea and vomiting.  Genitourinary: Negative for dysuria and urgency.  Musculoskeletal: Negative for myalgias.  Skin: Negative for rash.  Neurological: Negative for dizziness, sensory change, speech change,  focal weakness and headaches.  Psychiatric/Behavioral: Positive for memory loss. Negative for depression.   MEDICATIONS AT HOME:   Prior to Admission medications   Medication Sig Start Date End Date Taking? Authorizing Provider  azelastine (ASTELIN) 0.1 % nasal spray ONE SPRAY EACH NOSTRIL TWICE A DAY AS NEEDED FOR ALLERGIES 11/08/18  Yes [provider]  cetirizine (ZYRTEC) 10 MG tablet Take 1 tablet (10 mg total) by mouth daily. 08/10/15  Yes Krebs, Amy Lauren, NP  COMBIGAN 0.2-0.5 % ophthalmic solution INSTILL 1 DROP BY OPHTHALMIC ROUTE EVERY 12 HOURS INTO RIGHT EYE 02/15/17  Yes [provider]  estradiol (ESTRACE VAGINAL) 0.1 MG/GM vaginal cream Place 1 Applicatorful vaginally once a week.    Yes [provider]  latanoprost (XALATAN) 0.005 % ophthalmic solution INSTILL 1 DROP INTO BOTH EYES EVERY DAY IN THE EVENING 02/15/17  Yes [provider]  omeprazole (PRILOSEC) 20 MG capsule TAKE 30 MINUTES PRIOR TO FIRST MEAL OF DAY AS NEEDED FOR REFLUX 11/08/18  Yes [provider]  RESTASIS 0.05 % ophthalmic emulsion INSTILL 1 DROP BY OPHTHALMIC ROUTE EVERY 12 HOURS OU 03/03/17  Yes [provider]      VITAL SIGNS:  Blood pressure (!) 160/87, pulse 60, temperature 97.9 F (36.6 C), temperature source Oral, resp. rate 14, SpO2 100 %.  PHYSICAL EXAMINATION:   Physical Exam  GENERAL:  63 y.o.-year-old patient lying in the bed with no acute distress.  EYES: Pupils equal, round, reactive to light and accommodation. No scleral icterus. Extraocular muscles intact.  HEENT: Head atraumatic, normocephalic. Oropharynx and nasopharynx clear.  NECK:  Supple, no jugular venous distention. No thyroid enlargement, no tenderness.  LUNGS: Normal breath sounds bilaterally, no wheezing, rales,rhonchi or crepitation. No use of accessory muscles of respiration.  CARDIOVASCULAR: S1, S2 normal. No murmurs, rubs, or gallops.  ABDOMEN: Soft, nontender,  nondistended. Bowel sounds present. No organomegaly or mass.  EXTREMITIES: No pedal edema, cyanosis, or clubbing. No rash or lesions. + pedal pulses MUSCULOSKELETAL: Normal bulk, and power was 5+ grip and elbow, knee, and ankle flexion and extension bilaterally.  NEUROLOGIC: Alert and oriented x 3. CN 2-12 intact. Sensation to light touch and cold stimuli intact bilaterally. Finger to nose nl. Babinski is downgoing. DTR's (biceps, patellar, and achilles) 2+ and symmetric throughout. Gait not tested due to safety concern. PSYCHIATRIC: The patient is alert and oriented x 3.  SKIN: No obvious rash, lesion, or ulcer.   DATA REVIEWED:  LABORATORY PANEL:   CBC Recent Labs  Lab 01/12/19 1132  WBC 7.3  HGB 14.5  HCT 43.5  PLT 256   ------------------------------------------------------------------------------------------------------------------  Chemistries  Recent Labs  Lab 01/12/19 1132  NA 141  K 4.4  CL 106  CO2 28  GLUCOSE 108*  BUN 26*  CREATININE 0.85  CALCIUM 9.5  AST 22  ALT 14  ALKPHOS 63  BILITOT 0.8   ------------------------------------------------------------------------------------------------------------------  Cardiac Enzymes No results for input(s): TROPONINI in the last 168 hours. ------------------------------------------------------------------------------------------------------------------  RADIOLOGY:  Ct Head Code Stroke Wo Contrast  Result Date: 01/12/2019 CLINICAL DATA:  Code stroke.  Memory difficulty for 3 hours EXAM: CT HEAD  WITHOUT CONTRAST TECHNIQUE: Contiguous axial images were obtained from the base of the skull through the vertex without intravenous contrast. COMPARISON:  None. FINDINGS: Brain: No evidence of acute infarction, hemorrhage, hydrocephalus, extra-axial collection or mass lesion/mass effect. Vascular: No hyperdense vessel or unexpected calcification. Skull: Normal. Negative for fracture or focal lesion. Sinuses/Orbits: No acute  finding. Other: These results were called by telephone at the time of interpretation on 01/12/2019 at 11:27 am to Dr. Joni Fears , who verbally acknowledged these results. ASPECTS [facility] Stroke Program Early CT Score) Not scored with this history IMPRESSION: Negative study. Electronically Signed   By: Monte Fantasia M.D.   On: 01/12/2019 11:28    EKG:  EKG: normal EKG, normal sinus rhythm, unchanged from previous tracings. Vent. rate 71 BPM PR interval * ms QRS duration 92 ms QT/QTc 419/456 ms P-R-T axes 16 66 62 IMPRESSION AND PLAN:   63 y.o. female with past medical history of migraine with aura, glaucoma, allergic rhinitis, and H. pylori presenting to the ED with chief complaints of transient memory loss.  1.Transient memory loss -symptoms present testing consistent with transient global amnesia however ischemic event/TIA/complex migraine/seizures cannot be ruled out. - Admit to medsurg unit - CT head reviewed and shows no acute intracranial abnormality  - Check HgbA1c, fasting lipid panel - Obtain MRI  of the brain without contrast - PT consult, OT consult, Speech consult - Echocardiogram -* EEG - Patient was not on any antiplatelet or anticoagulation prior to this event.  Will start prophylactic therapy- ASA 81mg  PO daily - NPO until RN stroke swallow screen -Telemetry monitoring - Frequent neuro checks - Neurology consult placed. Message sent via Haiku to Dr. Irish Elders  2. Elevated Blood pressure on Presentation. No hx Hypertension - Liberal bp control for now given concerns for ischemic event - Long term Goal BP <140/90 - PRN antihypertensives  3. Migraine headaches with aura - PRN pain management  4.GERD - Protonix  5. DVT prophylaxis - Enoxaparin SubQ    All the records are reviewed and case discussed with ED provider. Management plans discussed with the patient, family and they are in agreement.  CODE STATUS: FULL  TOTAL TIME TAKING CARE OF THIS PATIENT: 50  minutes.    on 01/12/2019 at 2:22 PM  Rufina Falco, DNP, FNP-BC [facility] Nurse Practitioner Between 7am to 6pm - Pager [phone]  After 6pm go to [email or web address] - password [email or web address] [facility]  Office  [phone]  CC: Primary care physician; Chad Cordial, PA-C

## 2019-01-12 NOTE — ED Notes (Signed)
Called [phone] to initiate code stroke 1127

## 2019-01-12 NOTE — ED Triage Notes (Signed)
Per husband patient has exhibited trouble remembering things x past 3 hours. Face symmetrical. Grips equal. Speech clear.

## 2019-01-12 NOTE — Consult Note (Signed)
[facility] Tele[facility]urology Consult Services   Date of Service:   01/12/2019 11:24:14  Impression:     .  Rule Out Acute Ischemic Stroke  Comments/Sign-Out: Patients clinical features can be compatible with diagnosis of acute ischemic stroke however other vascular and non-vascular conditions that present with an acute neurological deficit simulating acute ischemic stroke is possible. Pt has a strong FH and personal history of migraine Chief differential include: Complex migraine seizures Transient global amnesia  Metrics: Last Known Well: 01/12/2019 08:30:14 [facility] Notification Time: 01/12/2019 11:24:14 Arrival Time: 01/12/2019 11:08:14 Stamp Time: 01/12/2019 11:24:14 Time First Login Attempt: 01/12/2019 11:27:53 Video Start Time: 01/12/2019 11:27:53  Symptoms: memory loss NIHSS Start Assessment Time: 01/12/2019 11:40:53 Patient is not a candidate for Alteplase/Activase. Patient was not deemed candidate for Alteplase/Activase thrombolytics because of Resolved symptoms (no residual disabling symptoms). Video End Time: 01/12/2019 12:02:01  CT head showed no acute hemorrhage or acute core infarct.  Clinical Presentation is not Suggestive of Large Vessel Occlusive Disease  Radiologist was not called back for review of advanced imaging because NA ED Physician notified of diagnostic impression and management plan on 01/12/2019 12:05:24  Our recommendations are outlined below.  Recommendations:     .  Activate Stroke Protocol Admission/Order Set     .  Stroke/Telemetry Floor     .  Neuro Checks     .  Bedside Swallow Eval     .  DVT Prophylaxis     .  IV Fluids, Normal Saline     .  Head of Bed 30 Degrees     .  Euglycemia and Avoid Hyperthermia (PRN Acetaminophen)     .  Antiplatelet Therapy Recommended     .  EEG  Routine Consultation with [facility] for Follow up Care  Sign Out:     .  Discussed with Emergency Department  Provider    ------------------------------------------------------------------------------  History of Present Illness: Patient is a 63 year old Female.  Patient was brought by private transportation with symptoms of memory loss  Arrival time: 11:08 Information obtained from pts fiancee and patient h/o migraine, glaucoma, migraine aura (spots) without heachache Chronology: Pt woke up okay At 7:30 am she took coffee Then she went to do outside yardwork At around 8:30 am she had trouble remembering, she felt sick to her stomach. She started asking her fiance repeated questions. Pt feels her symptoms have now resolved CTH: negative Medications: Glaucoma meds BP: 156/79 Blood glucose:pending  Last seen normal was within 4.5 hours. There is no history of hemorrhagic complications or intracranial hemorrhage. There is no history of Recent Anticoagulants. There is no history of recent major surgery. There is no history of recent stroke.  Past Medical History:    Examination: 1A: Level of Consciousness - Alert; keenly responsive + 0 1B: Ask Month and Age - Both Questions Right + 0 1C: Blink Eyes & Squeeze Hands - Performs Both Tasks + 0 2: Test Horizontal Extraocular Movements - Normal + 0 3: Test Visual Fields - No Visual Loss + 0 4: Test Facial Palsy (Use Grimace if Obtunded) - Normal symmetry + 0 5A: Test Left Arm Motor Drift - No Drift for 10 Seconds + 0 5B: Test Right Arm Motor Drift - No Drift for 10 Seconds + 0 6A: Test Left Leg Motor Drift - No Drift for 5 Seconds + 0 6B: Test Right Leg Motor Drift - No Drift for 5 Seconds + 0 7: Test Limb Ataxia (FNF/Heel-Shin) - No Ataxia +  0 8: Test Sensation - Normal; No sensory loss + 0 9: Test Language/Aphasia - Normal; No aphasia + 0 10: Test Dysarthria - Normal + 0 11: Test Extinction/Inattention - No abnormality + 0  NIHSS Score: 0  Patient/Family was informed the Neurology Consult would happen via TeleHealth consult by way of  interactive audio and video telecommunications and consented to receiving care in this manner.   Due to the immediate potential for life-threatening deterioration due to underlying acute neurologic illness, I spent 35 minutes providing critical care. This time includes time for face to face visit via telemedicine, review of medical records, imaging studies and discussion of findings with providers, the patient and/or family.   Dr Suzi Roots Danaiya Steadman   [facility] [phone]   Case [id number]

## 2019-01-12 NOTE — ED Notes (Signed)
Pt given jello and applesauce

## 2019-01-12 NOTE — ED Notes (Signed)
MRI on phone to do screening

## 2019-01-13 ENCOUNTER — Inpatient Hospital Stay: Admit: 2019-01-13 | Payer: PRIVATE HEALTH INSURANCE

## 2019-01-13 ENCOUNTER — Inpatient Hospital Stay: Payer: PRIVATE HEALTH INSURANCE

## 2019-01-13 DIAGNOSIS — G459 Transient cerebral ischemic attack, unspecified: Secondary | ICD-10-CM

## 2019-01-13 DIAGNOSIS — G454 Transient global amnesia: Secondary | ICD-10-CM | POA: Diagnosis not present

## 2019-01-13 MED ORDER — ASPIRIN 81 MG PO CHEW: 81.0000 mg | CHEWABLE_TABLET | Freq: Every day | ORAL | 0 refills | Status: DC

## 2019-01-13 NOTE — Consult Note (Signed)
Reason for Consult: confusion and disorientation  Referring Physician: Dr. Benjie Karvonen  CC: confusion and disorientation   HPI: Kellie Willis is an 63 y.o. female with past medical history of migraine with aura, glaucoma, allergic rhinitis, and H. pylori presenting to the ED with chief complaints of transient memory loss. Patient was acting abnormal as per husband yesterday AM. She was having difficulty remembering recent events and asking the same questions over and over.  Self resolved and back to baseline.    Past Medical History:  Diagnosis Date  . Allergy   . BV (bacterial vaginosis)   . 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 Diverticulosis   . Genital herpes 2016  . Glaucoma (increased eye pressure)   . Osteopenia     Past Surgical History:  Procedure Laterality Date  . COLONOSCOPY  2018   neg; repeat in 5 yrs due to [place] polyps  . CRYOTHERAPY  1979  . LIPOSUCTION    . TONSILLECTOMY      Family History  Problem Relation Age of Onset  . Breast cancer Paternal Grandmother 28  . Hypertension Mother   . Cervical cancer Mother 8       no chemo  . Colon polyps Sister   . Hypertension Brother     Social History:  reports that she has never smoked. She has never used smokeless tobacco. She reports current alcohol use. She reports that she does not use drugs.  No Known Allergies  Medications: I have reviewed the patient's current medications.  ROS: History obtained from the patient  General ROS: negative for - chills, fatigue, fever, night sweats, weight gain or weight loss Psychological ROS: negative for - behavioral disorder, hallucinations, memory difficulties, mood swings or suicidal ideation Ophthalmic ROS: negative for - blurry vision, double vision, eye pain or loss of vision ENT ROS: negative for - epistaxis, nasal discharge, oral lesions, sore throat, tinnitus or vertigo Allergy and Immunology ROS: negative for - hives or itchy/watery  eyes Hematological and Lymphatic ROS: negative for - bleeding problems, bruising or swollen lymph nodes Endocrine ROS: negative for - galactorrhea, hair pattern changes, polydipsia/polyuria or temperature intolerance Respiratory ROS: negative for - cough, hemoptysis, shortness of breath or wheezing Cardiovascular ROS: negative for - chest pain, dyspnea on exertion, edema or irregular heartbeat Gastrointestinal ROS: negative for - abdominal pain, diarrhea, hematemesis, nausea/vomiting or stool incontinence Genito-Urinary ROS: negative for - dysuria, hematuria, incontinence or urinary frequency/urgency Musculoskeletal ROS: negative for - joint swelling or muscular weakness Neurological ROS: as noted in HPI Dermatological ROS: negative for rash and skin lesion changes  Physical Examination: Blood pressure 111/63, pulse (!) 53, temperature 97.9 F (36.6 C), temperature source Oral, resp. rate 16, height 5' 1.5" (1.562 m), weight 62.6 kg, SpO2 98 %.   Neurological Examination   Mental Status: Alert, oriented, thought content appropriate.  Speech fluent without evidence of aphasia.  Able to follow 3 step commands without difficulty. Cranial Nerves: II: Discs flat bilaterally; Visual fields grossly normal, pupils equal, round, reactive to light and accommodation III,IV, VI: ptosis not present, extra-ocular motions intact bilaterally V,VII: smile symmetric, facial light touch sensation normal bilaterally VIII: hearing normal bilaterally IX,X: gag reflex present XI: bilateral shoulder shrug XII: midline tongue extension Motor: Right : Upper extremity   5/5    Left:     Upper extremity   5/5  Lower extremity   5/5     Lower extremity   5/5 Tone and bulk:normal  tone throughout; no atrophy noted Sensory: Pinprick and light touch intact throughout, bilaterally Deep Tendon Reflexes: 2+ and symmetric throughout Plantars: Right: downgoing   Left: downgoing Cerebellar: normal finger-to-nose,  normal rapid alternating movements and normal heel-to-shin test Gait: normal gait and station      Laboratory Studies:   Basic Metabolic Panel: Recent Labs  Lab 01/12/19 1132  NA 141  K 4.4  CL 106  CO2 28  GLUCOSE 108*  BUN 26*  CREATININE 0.85  CALCIUM 9.5    Liver Function Tests: Recent Labs  Lab 01/12/19 1132  AST 22  ALT 14  ALKPHOS 63  BILITOT 0.8  PROT 7.5  ALBUMIN 4.1   No results for input(s): LIPASE, AMYLASE in the last 168 hours. No results for input(s): AMMONIA in the last 168 hours.  CBC: Recent Labs  Lab 01/12/19 1132  WBC 7.3  NEUTROABS 5.1  HGB 14.5  HCT 43.5  MCV 97.8  PLT 256    Cardiac Enzymes: No results for input(s): CKTOTAL, CKMB, CKMBINDEX, TROPONINI in the last 168 hours.  BNP: Invalid input(s): POCBNP  CBG: Recent Labs  Lab 01/12/19 1153  GLUCAP 110*    Microbiology: Results for orders placed or performed during the hospital encounter of 01/12/19  SARS Coronavirus 2 [facility] order, Performed in [facility] hospital lab) Nasopharyngeal Nasopharyngeal Swab     Status: None   Collection Time: 01/12/19 12:39 PM   Specimen: Nasopharyngeal Swab  Result Value Ref Range Status   SARS Coronavirus 2 NEGATIVE NEGATIVE Final    Comment: (NOTE) If result is NEGATIVE SARS-CoV-2 target nucleic acids are NOT DETECTED. The SARS-CoV-2 RNA is generally detectable in upper and lower  respiratory specimens during the acute phase of infection. The lowest  concentration of SARS-CoV-2 viral copies this assay can detect is 250  copies / mL. A negative result does not preclude SARS-CoV-2 infection  and should not be used as the sole basis for treatment or other  patient management decisions.  A negative result may occur with  improper specimen collection / handling, submission of specimen other  than nasopharyngeal swab, presence of viral mutation(s) within the  areas targeted by this assay, and inadequate number of viral copies  (<250  copies / mL). A negative result must be combined with clinical  observations, patient history, and epidemiological information. If result is POSITIVE SARS-CoV-2 target nucleic acids are DETECTED. The SARS-CoV-2 RNA is generally detectable in upper and lower  respiratory specimens dur ing the acute phase of infection.  Positive  results are indicative of active infection with SARS-CoV-2.  Clinical  correlation with patient history and other diagnostic information is  necessary to determine patient infection status.  Positive results do  not rule out bacterial infection or co-infection with other viruses. If result is PRESUMPTIVE POSTIVE SARS-CoV-2 nucleic acids MAY BE PRESENT.   A presumptive positive result was obtained on the submitted specimen  and confirmed on repeat testing.  While 2019 novel coronavirus  (SARS-CoV-2) nucleic acids may be present in the submitted sample  additional confirmatory testing may be necessary for epidemiological  and / or clinical management purposes  to differentiate between  SARS-CoV-2 and other Sarbecovirus currently known to infect humans.  If clinically indicated additional testing with an alternate test  methodology [phone]) is advised. The SARS-CoV-2 RNA is generally  detectable in upper and lower respiratory sp ecimens during the acute  phase of infection. The expected result is Negative. Fact Sheet for Patients:  StrictlyIdeas.no Fact Sheet  for Healthcare Providers: BankingDealers.co.za This test is not yet approved or cleared by the [place] and has been authorized for detection and/or diagnosis of SARS-CoV-2 by FDA under an Emergency Use Authorization (EUA).  This EUA will remain in effect (meaning this test can be used) for the duration of the COVID-19 declaration under Section 564(b)(1) of the Act, 21 U.S.C. section 360bbb-3(b)(1), unless the authorization is terminated or revoked  sooner. Performed at [facility], [address] [place], [place] [address]     Coagulation Studies: Recent Labs    01/12/19 1132  LABPROT 12.6  INR 1.0    Urinalysis:  Recent Labs  Lab 01/12/19 1215  COLORURINE YELLOW*  LABSPEC 1.018  PHURINE 6.0  GLUCOSEU NEGATIVE  HGBUR NEGATIVE  BILIRUBINUR NEGATIVE  KETONESUR NEGATIVE  PROTEINUR NEGATIVE  NITRITE NEGATIVE  LEUKOCYTESUR NEGATIVE    Lipid Panel:     Component Value Date/Time   CHOL 223 (H) 01/12/2019 1132   TRIG 110 01/12/2019 1132   HDL 59 01/12/2019 1132   CHOLHDL 3.8 01/12/2019 1132   VLDL 22 01/12/2019 1132   LDLCALC 142 (H) 01/12/2019 1132    HgbA1C:  Lab Results  Component Value Date   HGBA1C 5.5 01/12/2019    Urine Drug Screen:      Component Value Date/Time   LABOPIA NONE DETECTED 01/12/2019 1215   COCAINSCRNUR NONE DETECTED 01/12/2019 1215   LABBENZ NONE DETECTED 01/12/2019 1215   AMPHETMU NONE DETECTED 01/12/2019 1215   THCU NONE DETECTED 01/12/2019 1215   LABBARB NONE DETECTED 01/12/2019 1215    Alcohol Level:  Recent Labs  Lab 01/12/19 1132  ETH <10    Other results: EKG: normal EKG, normal sinus rhythm, unchanged from previous tracings.  Imaging: Mr Brain Wo Contrast  Result Date: 01/13/2019 CLINICAL DATA:  Initial evaluation for acute transient memory loss, TIA. EXAM: MRI HEAD WITHOUT CONTRAST TECHNIQUE: Multiplanar, multiecho pulse sequences of the brain and surrounding structures were obtained without intravenous contrast. COMPARISON:  Prior CT from 01/12/2019. FINDINGS: Brain: Cerebral volume within normal limits for patient age. No focal parenchymal signal abnormality identified. No abnormal foci of restricted diffusion to suggest acute or subacute ischemia. Gray-white matter differentiation well maintained. No encephalomalacia to suggest chronic infarction. No foci of susceptibility artifact to suggest acute or chronic intracranial hemorrhage. No mass lesion,  midline shift or mass effect. No hydrocephalus. No extra-axial fluid collection. Major dural sinuses are grossly patent. Pituitary gland and suprasellar region are normal. Midline structures intact and normal. Vascular: Major intracranial vascular flow voids well maintained and normal in appearance. Skull and upper cervical spine: Craniocervical junction normal. Visualized upper cervical spine within normal limits. Bone marrow signal intensity normal. No scalp soft tissue abnormality. Sinuses/Orbits: Globes and orbital soft tissues within normal limits. Moderate mucosal thickening present within the right maxillary sinus, likely allergic/inflammatory nature. Paranasal sinuses are otherwise largely clear. No significant mastoid effusion. Inner ear structures normal. Other: None. IMPRESSION: 1. Normal brain MRI.  No acute intracranial abnormality identified. 2. Moderate right maxillary sinusitis, likely allergic/inflammatory in nature. Electronically Signed   By: Jeannine Boga M.D.   On: 01/13/2019 04:36   US Carotid Bilateral (at [facility] Only)  Result Date: 01/13/2019 CLINICAL DATA:  TIA. EXAM: BILATERAL CAROTID DUPLEX ULTRASOUND TECHNIQUE: Pearline Cables scale imaging, color Doppler and duplex ultrasound were performed of bilateral carotid and vertebral arteries in the neck. COMPARISON:  None. FINDINGS: Criteria: Quantification of carotid stenosis is based on velocity parameters that correlate the residual internal  carotid diameter with NASCET-based stenosis levels, using the diameter of the distal internal carotid lumen as the denominator for stenosis measurement. The following velocity measurements were obtained: RIGHT ICA: 72/21 cm/sec CCA: [id number] cm/sec SYSTOLIC ICA/CCA RATIO:  0.8 ECA: 89 cm/sec LEFT ICA: 70/25 cm/sec CCA: [id number] cm/sec SYSTOLIC ICA/CCA RATIO:  1.1 ECA: 153 cm/sec RIGHT CAROTID ARTERY: There is mild tortuosity of the right common carotid artery. There is no grayscale evidence of significant  intimal thickening or atherosclerotic plaque affecting the interrogated portions of the right carotid system. There are no elevated peak systolic velocities within the interrogated course of the right internal carotid artery to suggest a hemodynamically significant stenosis. RIGHT VERTEBRAL ARTERY:  Antegrade Flow LEFT CAROTID ARTERY: There is a minimal amount of eccentric mixed echogenic plaque within the left carotid bulb (image 53) and 57), extending to involve the origin and proximal aspects of the left internal carotid artery (image 65), not resulting in elevated peak systolic velocities within the interrogated course of the left internal carotid artery to suggest a hemodynamically significant stenosis LEFT VERTEBRAL ARTERY:  Antegrade flow IMPRESSION: 1. Minimal amount of left-sided atherosclerotic plaque, not resulting in a hemodynamically significant stenosis. 2. Unremarkable sonographic evaluation of the right carotid system Electronically Signed   By: Sandi Mariscal M.D.   On: 01/13/2019 08:32   Ct Head Code Stroke Wo Contrast  Result Date: 01/12/2019 CLINICAL DATA:  Code stroke.  Memory difficulty for 3 hours EXAM: CT HEAD WITHOUT CONTRAST TECHNIQUE: Contiguous axial images were obtained from the base of the skull through the vertex without intravenous contrast. COMPARISON:  None. FINDINGS: Brain: No evidence of acute infarction, hemorrhage, hydrocephalus, extra-axial collection or mass lesion/mass effect. Vascular: No hyperdense vessel or unexpected calcification. Skull: Normal. Negative for fracture or focal lesion. Sinuses/Orbits: No acute finding. Other: These results were called by telephone at the time of interpretation on 01/12/2019 at 11:27 am to Dr. Joni Fears , who verbally acknowledged these results. ASPECTS [facility] Stroke Program Early CT Score) Not scored with this history IMPRESSION: Negative study. Electronically Signed   By: Monte Fantasia M.D.   On: 01/12/2019 11:28      Assessment/Plan:   63 y.o. female with past medical history of migraine with aura, glaucoma, allergic rhinitis, and H. pylori presenting to the ED with chief complaints of transient memory loss. Patient was acting abnormal as per husband yesterday AM. She was having difficulty remembering recent events and asking the same questions over and over.  Self resolved and back to baseline.    - Stress could have provoked by recent tumor diagnosis in her dog - ? Transient global amnesia - Normal MRI and CTH - ASA 81mg  daily - d/p today 01/13/2019, 10:26 AM

## 2019-01-13 NOTE — Discharge Summary (Addendum)
[facility] [place] NAME: Kellie Willis    MR#:  [id number]  DATE OF BIRTH:  25-Mar-1956  DATE OF ADMISSION:  01/12/2019 ADMITTING PHYSICIAN: Lang Snow, NP  DATE OF DISCHARGE: 01/13/2019  PRIMARY CARE PHYSICIAN: Copland, Deirdre Evener, PA-C    ADMISSION DIAGNOSIS:  TIA (transient ischemic attack) [G45.9]  DISCHARGE DIAGNOSIS:  Active Problems:   TGA  SECONDARY DIAGNOSIS:   Past Medical History:  Diagnosis Date  . Allergy   . BV (bacterial vaginosis)   . 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 Diverticulosis   . Genital herpes 2016  . Glaucoma (increased eye pressure)   . Osteopenia     HOSPITAL COURSE:  * 63 year old female with a history of migraine with aura who presents with transient memory loss.   1.  Transient global amnesia: Patient symptoms are consistent with this diagnosis.  She underwent neurological work-up which essentially was unremarkable. It is likely that her current stressful situation with her dog may have triggered this. 2.  Migraine headaches with aura: She reports that these have been very infrequent.   DISCHARGE CONDITIONS AND DIET:   Stable for discharge regular diet  CONSULTS OBTAINED:  Treatment Team:  Leotis Pain, MD  DRUG ALLERGIES:  No Known Allergies  DISCHARGE MEDICATIONS:   Allergies as of 01/13/2019   No Known Allergies     Medication List    TAKE these medications   aspirin 81 MG chewable tablet Commonly known as: Aspirin Childrens Chew 1 tablet (81 mg total) by mouth daily.   azelastine 0.1 % nasal spray Commonly known as: ASTELIN ONE SPRAY EACH NOSTRIL TWICE A DAY AS NEEDED FOR ALLERGIES   cetirizine 10 MG tablet Commonly known as: ZYRTEC Take 1 tablet (10 mg total) by mouth daily.   Combigan 0.2-0.5 % ophthalmic solution Generic drug: brimonidine-timolol INSTILL 1 DROP BY OPHTHALMIC ROUTE EVERY 12 HOURS INTO RIGHT EYE   ESTRACE  VAGINAL 0.1 MG/GM vaginal cream Generic drug: estradiol Place 1 Applicatorful vaginally once a week.   latanoprost 0.005 % ophthalmic solution Commonly known as: XALATAN INSTILL 1 DROP INTO BOTH EYES EVERY DAY IN THE EVENING   omeprazole 20 MG capsule Commonly known as: PRILOSEC TAKE 30 MINUTES PRIOR TO FIRST MEAL OF DAY AS NEEDED FOR REFLUX   Restasis 0.05 % ophthalmic emulsion Generic drug: cycloSPORINE INSTILL 1 DROP BY OPHTHALMIC ROUTE EVERY 12 HOURS OU         Today   CHIEF COMPLAINT:   Patient doing well.  Patient feels back to her baseline.  Patient denies any neurological deficits.   VITAL SIGNS:  Blood pressure 111/63, pulse (!) 53, temperature 97.9 F (36.6 C), temperature source Oral, resp. rate 16, height 5' 1.5" (1.562 m), weight 62.6 kg, SpO2 98 %.   REVIEW OF SYSTEMS:  Review of Systems  Constitutional: Negative.  Negative for chills, fever and malaise/fatigue.  HENT: Negative.  Negative for ear discharge, ear pain, hearing loss, nosebleeds and sore throat.   Eyes: Negative.  Negative for blurred vision and pain.  Respiratory: Negative.  Negative for cough, hemoptysis, shortness of breath and wheezing.   Cardiovascular: Negative.  Negative for chest pain, palpitations and leg swelling.  Gastrointestinal: Negative.  Negative for abdominal pain, blood in stool, diarrhea, nausea and vomiting.  Genitourinary: Negative.  Negative for dysuria.  Musculoskeletal: Negative.  Negative for back pain.  Skin: Negative.   Neurological: Negative for dizziness, tremors, speech change,  focal weakness, seizures and headaches.  Endo/Heme/Allergies: Negative.  Does not bruise/bleed easily.  Psychiatric/Behavioral: Negative.  Negative for depression, hallucinations and suicidal ideas.     PHYSICAL EXAMINATION:  GENERAL:  63 y.o.-year-old patient lying in the bed with no acute distress.  NECK:  Supple, no jugular venous distention. No thyroid enlargement, no tenderness.   LUNGS: Normal breath sounds bilaterally, no wheezing, rales,rhonchi  No use of accessory muscles of respiration.  CARDIOVASCULAR: S1, S2 normal. No murmurs, rubs, or gallops.  ABDOMEN: Soft, non-tender, non-distended. Bowel sounds present. No organomegaly or mass.  EXTREMITIES: No pedal edema, cyanosis, or clubbing.  PSYCHIATRIC: The patient is alert and oriented x 3.  SKIN: No obvious rash, lesion, or ulcer.   DATA REVIEW:   CBC Recent Labs  Lab 01/12/19 1132  WBC 7.3  HGB 14.5  HCT 43.5  PLT 256    Chemistries  Recent Labs  Lab 01/12/19 1132  NA 141  K 4.4  CL 106  CO2 28  GLUCOSE 108*  BUN 26*  CREATININE 0.85  CALCIUM 9.5  AST 22  ALT 14  ALKPHOS 63  BILITOT 0.8    Cardiac Enzymes No results for input(s): TROPONINI in the last 168 hours.  Microbiology Results  @MICRORSLT48 @  RADIOLOGY:  Mr Brain 31 Contrast  Result Date: 01/13/2019 CLINICAL DATA:  Initial evaluation for acute transient memory loss, TIA. EXAM: MRI HEAD WITHOUT CONTRAST TECHNIQUE: Multiplanar, multiecho pulse sequences of the brain and surrounding structures were obtained without intravenous contrast. COMPARISON:  Prior CT from 01/12/2019. FINDINGS: Brain: Cerebral volume within normal limits for patient age. No focal parenchymal signal abnormality identified. No abnormal foci of restricted diffusion to suggest acute or subacute ischemia. Gray-white matter differentiation well maintained. No encephalomalacia to suggest chronic infarction. No foci of susceptibility artifact to suggest acute or chronic intracranial hemorrhage. No mass lesion, midline shift or mass effect. No hydrocephalus. No extra-axial fluid collection. Major dural sinuses are grossly patent. Pituitary gland and suprasellar region are normal. Midline structures intact and normal. Vascular: Major intracranial vascular flow voids well maintained and normal in appearance. Skull and upper cervical spine: Craniocervical junction normal.  Visualized upper cervical spine within normal limits. Bone marrow signal intensity normal. No scalp soft tissue abnormality. Sinuses/Orbits: Globes and orbital soft tissues within normal limits. Moderate mucosal thickening present within the right maxillary sinus, likely allergic/inflammatory nature. Paranasal sinuses are otherwise largely clear. No significant mastoid effusion. Inner ear structures normal. Other: None. IMPRESSION: 1. Normal brain MRI.  No acute intracranial abnormality identified. 2. Moderate right maxillary sinusitis, likely allergic/inflammatory in nature. Electronically Signed   By: Jeannine Boga M.D.   On: 01/13/2019 04:36   US Carotid Bilateral (at [facility] Only)  Result Date: 01/13/2019 CLINICAL DATA:  TIA. EXAM: BILATERAL CAROTID DUPLEX ULTRASOUND TECHNIQUE: Pearline Cables scale imaging, color Doppler and duplex ultrasound were performed of bilateral carotid and vertebral arteries in the neck. COMPARISON:  None. FINDINGS: Criteria: Quantification of carotid stenosis is based on velocity parameters that correlate the residual internal carotid diameter with NASCET-based stenosis levels, using the diameter of the distal internal carotid lumen as the denominator for stenosis measurement. The following velocity measurements were obtained: RIGHT ICA: 72/21 cm/sec CCA: [id number] cm/sec SYSTOLIC ICA/CCA RATIO:  0.8 ECA: 89 cm/sec LEFT ICA: 70/25 cm/sec CCA: [id number] cm/sec SYSTOLIC ICA/CCA RATIO:  1.1 ECA: 153 cm/sec RIGHT CAROTID ARTERY: There is mild tortuosity of the right common carotid artery. There is no grayscale evidence of significant intimal thickening or atherosclerotic plaque  affecting the interrogated portions of the right carotid system. There are no elevated peak systolic velocities within the interrogated course of the right internal carotid artery to suggest a hemodynamically significant stenosis. RIGHT VERTEBRAL ARTERY:  Antegrade Flow LEFT CAROTID ARTERY: There is a minimal amount of  eccentric mixed echogenic plaque within the left carotid bulb (image 53) and 57), extending to involve the origin and proximal aspects of the left internal carotid artery (image 65), not resulting in elevated peak systolic velocities within the interrogated course of the left internal carotid artery to suggest a hemodynamically significant stenosis LEFT VERTEBRAL ARTERY:  Antegrade flow IMPRESSION: 1. Minimal amount of left-sided atherosclerotic plaque, not resulting in a hemodynamically significant stenosis. 2. Unremarkable sonographic evaluation of the right carotid system Electronically Signed   By: Sandi Mariscal M.D.   On: 01/13/2019 08:32   Ct Head Code Stroke Wo Contrast  Result Date: 01/12/2019 CLINICAL DATA:  Code stroke.  Memory difficulty for 3 hours EXAM: CT HEAD WITHOUT CONTRAST TECHNIQUE: Contiguous axial images were obtained from the base of the skull through the vertex without intravenous contrast. COMPARISON:  None. FINDINGS: Brain: No evidence of acute infarction, hemorrhage, hydrocephalus, extra-axial collection or mass lesion/mass effect. Vascular: No hyperdense vessel or unexpected calcification. Skull: Normal. Negative for fracture or focal lesion. Sinuses/Orbits: No acute finding. Other: These results were called by telephone at the time of interpretation on 01/12/2019 at 11:27 am to Dr. Joni Fears , who verbally acknowledged these results. ASPECTS [facility] Stroke Program Early CT Score) Not scored with this history IMPRESSION: Negative study. Electronically Signed   By: Monte Fantasia M.D.   On: 01/12/2019 11:28      Allergies as of 01/13/2019   No Known Allergies     Medication List    TAKE these medications   aspirin 81 MG chewable tablet Commonly known as: Aspirin Childrens Chew 1 tablet (81 mg total) by mouth daily.   azelastine 0.1 % nasal spray Commonly known as: ASTELIN ONE SPRAY EACH NOSTRIL TWICE A DAY AS NEEDED FOR ALLERGIES   cetirizine 10 MG tablet Commonly known  as: ZYRTEC Take 1 tablet (10 mg total) by mouth daily.   Combigan 0.2-0.5 % ophthalmic solution Generic drug: brimonidine-timolol INSTILL 1 DROP BY OPHTHALMIC ROUTE EVERY 12 HOURS INTO RIGHT EYE   ESTRACE VAGINAL 0.1 MG/GM vaginal cream Generic drug: estradiol Place 1 Applicatorful vaginally once a week.   latanoprost 0.005 % ophthalmic solution Commonly known as: XALATAN INSTILL 1 DROP INTO BOTH EYES EVERY DAY IN THE EVENING   omeprazole 20 MG capsule Commonly known as: PRILOSEC TAKE 30 MINUTES PRIOR TO FIRST MEAL OF DAY AS NEEDED FOR REFLUX   Restasis 0.05 % ophthalmic emulsion Generic drug: cycloSPORINE INSTILL 1 DROP BY OPHTHALMIC ROUTE EVERY 12 HOURS OU          Management plans discussed with the patient and she is in agreement. Stable for discharge home  Patient should follow up with pcp  CODE STATUS:     Code Status Orders  (From admission, onward)         Start     Ordered   01/12/19 1417  Full code  Continuous     01/12/19 1418        Code Status History    This patient has a current code status but no historical code status.   Advance Care Planning Activity      TOTAL TIME TAKING CARE OF THIS PATIENT: 38 minutes.    Note: This  dictation was prepared with Dragon dictation along with smaller phrase technology. Any transcriptional errors that result from this process are unintentional.  Bettey Costa M.D on 01/13/2019 at 10:25 AM  Between 7am to 6pm - Pager - [phone] After 6pm go to [email or web address] - password [email or web address] [facility]  Office  [phone]  CC: Primary care physician; Chad Cordial, PA-C

## 2019-01-13 NOTE — Progress Notes (Signed)
Physical Therapy Evaluation Patient Details Name: BRISTOL GAUTHREAUX MRN: [id number] DOB: Aug 08, 1955 Today's Date: 01/13/2019   History of Present Illness  Patient is a 63 year old female with history of migraine with aura who presented to the emergency room with transient memory loss.  Patient evaluated in the emergency room with CT head done with no acute findings.  Clinical Impression  Patient performs gait without AD, 600 feet I, and ascending / descending steps x 4 with rail I. She performs heel raises, single leg stand, tandem stand with head turns and no balance deficits for static or dynamic standing balance. She has WFL strength and ROM and no skilled PT needs at this time.     Follow Up Recommendations No PT follow up    Equipment Recommendations       Recommendations for Other Services       Precautions / Restrictions        Mobility  Bed Mobility Overal bed mobility: Independent                Transfers Overall transfer level: Independent                  Ambulation/Gait Ambulation/Gait assistance: Independent Gait Distance (Feet): 600 Feet Assistive device: None Gait Pattern/deviations: (none)        Stairs Stairs: Yes Stairs assistance: Independent   Number of Stairs: 4 General stair comments: safe with steps  Wheelchair Mobility    Modified Rankin (Stroke Patients Only)       Balance Overall balance assessment: Independent(tandems stand, single leg stand WNL)                                           Pertinent Vitals/Pain Pain Assessment: No/denies pain    Home Living Family/patient expects to be discharged to:: Private residence Living Arrangements: Spouse/significant other Available Help at Discharge: Friend(s) Type of Home: House Home Access: Stairs to enter              Prior Function Level of Independence: Independent               Hand Dominance        Extremity/Trunk Assessment   Upper Extremity Assessment Upper Extremity Assessment: Overall WFL for tasks assessed    Lower Extremity Assessment Lower Extremity Assessment: Overall WFL for tasks assessed       Communication   Communication: No difficulties  Cognition Arousal/Alertness: Awake/alert Behavior During Therapy: WFL for tasks assessed/performed Overall Cognitive Status: Within Functional Limits for tasks assessed                                        General Comments      Exercises     Assessment/Plan    PT Assessment Patent does not need any further PT services  PT Problem List         PT Treatment Interventions      PT Goals (Current goals can be found in the Care Plan section)  Acute Rehab PT Goals Patient Stated Goal: to go home PT Goal Formulation: With patient Time For Goal Achievement: 01/27/19 Potential to Achieve Goals: Good    Frequency     Barriers to discharge        Co-evaluation  AM-PAC PT "6 Clicks" Mobility  Outcome Measure Help needed turning from your back to your side while in a flat bed without using bedrails?: None Help needed moving from lying on your back to sitting on the side of a flat bed without using bedrails?: None Help needed moving to and from a bed to a chair (including a wheelchair)?: None Help needed standing up from a chair using your arms (e.g., wheelchair or bedside chair)?: None Help needed to walk in hospital room?: None Help needed climbing 3-5 steps with a railing? : None 6 Click Score: 24    End of Session Equipment Utilized During Treatment: Gait belt Activity Tolerance: Patient tolerated treatment well Patient left: in bed;with bed alarm set        Time: [id number] PT Time Calculation (min) (ACUTE ONLY): 15 min   Charges:   PT Evaluation $PT Eval Low Complexity: 1 Low           Skylah Delauter, Sherryl Barters, PT DPT 01/13/2019, 10:03 AM

## 2019-01-13 NOTE — Plan of Care (Signed)
Patient assessed and educated; adequate for discharge.

## 2019-01-14 ENCOUNTER — Encounter: Payer: Self-pay | Admitting: Obstetrics and Gynecology

## 2019-01-15 ENCOUNTER — Encounter: Payer: Self-pay | Admitting: Obstetrics and Gynecology

## 2019-01-28 ENCOUNTER — Ambulatory Visit (INDEPENDENT_AMBULATORY_CARE_PROVIDER_SITE_OTHER): Payer: PRIVATE HEALTH INSURANCE | Admitting: Family Medicine

## 2019-01-28 ENCOUNTER — Other Ambulatory Visit: Payer: Self-pay

## 2019-01-28 ENCOUNTER — Encounter: Payer: Self-pay | Admitting: Family Medicine

## 2019-01-28 VITALS — BP 118/76 | HR 59 | Temp 98.7°F | Resp 16 | Ht 61.0 in | Wt 140.2 lb

## 2019-01-28 DIAGNOSIS — B351 Tinea unguium: Secondary | ICD-10-CM

## 2019-01-28 DIAGNOSIS — G454 Transient global amnesia: Secondary | ICD-10-CM

## 2019-01-28 MED ORDER — CICLOPIROX 8 % EX SOLN: Freq: Every day | CUTANEOUS | 2 refills | Status: DC

## 2019-01-28 NOTE — Patient Instructions (Signed)

## 2019-01-28 NOTE — Progress Notes (Signed)
Subjective:    Patient ID: Kellie Willis, female    DOB: 1956-03-10, 63 y.o.   MRN: [id number]  HPI  Patient presents to clinic to establish with PCP.  Also for hospital follow-up and has concerns of toenail fungus.  Patient was admitted with suspected TIA symptoms however TIA was ruled out with imaging, labs and she was diagnosed with TGA.  Suspect TGA occurred due to high stress situation regarding severe illness of her dog, dog's illnesses putting a lot of stress on her due to having to care for him.  I was able to review hospital admission via epic, discharge summary copied visit in chart and is highlighted in green. Lab work and imaging done in hospital reviewed as well and is unremarkable.  Since coming from hospital patient states she is feeling back to her normal self.  She sees GYN regularly for women's health exams.   DATE OF ADMISSION:  01/12/2019      ADMITTING PHYSICIAN: Lang Snow, NP  DATE OF DISCHARGE: 01/13/2019  PRIMARY CARE PHYSICIAN: Copland, Deirdre Evener, PA-C    ADMISSION DIAGNOSIS:  TIA (transient ischemic attack) [G45.9]  DISCHARGE DIAGNOSIS:  Active Problems:   TGA  SECONDARY DIAGNOSIS:       Past Medical History:  Diagnosis Date  . Allergy   . BV (bacterial vaginosis)   . 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 Diverticulosis   . Genital herpes 2016  . Glaucoma (increased eye pressure)   . Osteopenia     HOSPITAL COURSE:  * 63 year old female with a history of migraine with aura who presents with transient memory loss.   1.  Transient global amnesia: Patient symptoms are consistent with this diagnosis.  She underwent neurological work-up which essentially was unremarkable. It is likely that her current stressful situation with her dog may have triggered this. 2.  Migraine headaches with aura: She reports that these have been very infrequent.   DISCHARGE CONDITIONS AND DIET:    Stable for discharge regular diet  CBC Latest Ref Rng & Units 01/12/2019 03/29/2013  WBC 4.0 - 10.5 K/uL 7.3 6.4  Hemoglobin 12.0 - 15.0 g/dL 14.5 14.4  Hematocrit 36.0 - 46.0 % 43.5 42.0  Platelets 150 - 400 K/uL 256 222   BMP Latest Ref Rng & Units 01/12/2019 03/29/2013  Glucose 70 - 99 mg/dL 108(H) 113(H)  BUN 8 - 23 mg/dL 26(H) 18  Creatinine 0.44 - 1.00 mg/dL 0.85 0.98  Sodium 135 - 145 mmol/L 141 141  Potassium 3.5 - 5.1 mmol/L 4.4 3.5  Chloride 98 - 111 mmol/L 106 110(H)  CO2 22 - 32 mmol/L 28 25  Calcium 8.9 - 10.3 mg/dL 9.5 8.7   Past medical, social, surgical and family history reviewed and updated accordingly in chart.  Patient Active Problem List   Diagnosis Date Noted  . Seasonal allergies 03/06/2017  . Allergic rhinitis 08/10/2015   Social History   Tobacco Use  . Smoking status: Never Smoker  . Smokeless tobacco: Never Used  Substance Use Topics  . Alcohol use: Yes    Comment: occasional   Past Surgical History:  Procedure Laterality Date  . COLONOSCOPY  2018   neg; repeat in 5 yrs due to [place] polyps  . CRYOTHERAPY  1979  . LIPOSUCTION    . TONSILLECTOMY     Family History  Problem Relation Age of Onset  . Breast cancer Paternal Grandmother 28  . Hypertension Mother   . Cervical  cancer Mother 19       no chemo  . Cancer Mother   . Colon polyps Sister   . Hypertension Brother     Review of Systems   Constitutional: Negative for chills, fatigue and fever.  HENT: Negative for congestion, ear pain, sinus pain and sore throat.   Eyes: Negative.   Respiratory: Negative for cough, shortness of breath and wheezing.   Cardiovascular: Negative for chest pain, palpitations and leg swelling.  Gastrointestinal: Negative for abdominal pain, diarrhea, nausea and vomiting.  Genitourinary: Negative for dysuria, frequency and urgency.  Musculoskeletal: Negative for arthralgias and myalgias.  Skin: ?toenail fungus Neurological: Negative for syncope,  light-headedness and headaches.  Psychiatric/Behavioral: The patient is not nervous/anxious.       Objective:   Physical Exam Vitals signs and nursing note reviewed.  Constitutional:      General: She is not in acute distress.    Appearance: She is not ill-appearing, toxic-appearing or diaphoretic.  HENT:     Head: Normocephalic and atraumatic.  Eyes:     Extraocular Movements: Extraocular movements intact.     Conjunctiva/sclera: Conjunctivae normal.     Pupils: Pupils are equal, round, and reactive to light.  Neck:     Musculoskeletal: Normal range of motion and neck supple. No neck rigidity.     Vascular: No carotid bruit.  Cardiovascular:     Rate and Rhythm: Normal rate and regular rhythm.  Musculoskeletal:     Right lower leg: No edema.     Left lower leg: No edema.       Feet:  Feet:     Comments: White patchy look to bilateral great toenails indicated by cream mark on diagram.  This does appear consistent with toenail fungus. Lymphadenopathy:     Cervical: No cervical adenopathy.  Skin:    General: Skin is warm and dry.     Coloration: Skin is not jaundiced or pale.  Neurological:     General: No focal deficit present.     Mental Status: She is alert.     Cranial Nerves: No cranial nerve deficit.     Motor: No weakness.     Gait: Gait normal.  Psychiatric:        Mood and Affect: Mood normal.        Behavior: Behavior normal.        Thought Content: Thought content normal.        Judgment: Judgment normal.    Today's Vitals   01/28/19 1055  BP: 118/76  Pulse: (!) 59  Resp: 16  Temp: 98.7 F (37.1 C)  TempSrc: Oral  SpO2: 96%  Weight: 140 lb 3.2 oz (63.6 kg)  Height: 5\' 1"  (1.549 m)   Body mass index is 26.49 kg/m.     Assessment & Plan:    A total of 30  minutes were spent face-to-face with the patient during this encounter and over half of that time was spent on counseling and coordination of care. The patient was counseled on work-up she had  done in hospital, lab results, imaging results, treatment of toenail fungus.   1. TGA (transient global amnesia) Believed to be brought on by stressful situation regarding the illness of her dog.  Patient's neurological work-up in hospital was completely negative.  She is feeling 100% back to her normal self.  Declines referral to outpatient neurology at this time.  2. Toenail fungus Discussed with patient that toenail fungus often is hard to eradicate.  She  is agreeable to trial topical treatment to see if this will help toenail fungus improve and resolve.  Patient does have a podiatrist already, plans to mention toenail fungus to podiatry at her next appointment is coming up in about 2 or 3 months..  - ciclopirox (PENLAC) 8 % solution; Apply topically at bedtime. Apply over nail and surrounding skin. Apply daily over previous coat. After seven (7) days, may remove with alcohol and continue cycle. Do this for 4 weeks.  Dispense: 6.6 mL; Refill: 2   Patient will follow-up here annually and whenever needed.  Offered follow-up in 6 months for general recheck and new blood work, but she declines and states she will call us if she needs anything.

## 2019-02-01 ENCOUNTER — Ambulatory Visit: Payer: PRIVATE HEALTH INSURANCE | Admitting: Family Medicine

## 2019-03-10 NOTE — Progress Notes (Signed)
PCP: Patient, No Pcp Per   Chief Complaint  Patient presents with  . Gynecologic Exam    HPI:      Ms. [name] is a 63 y.o. G1P1001 who LMP was No LMP recorded. Patient is postmenopausal., presents today for her annual examination.  Her menses are absent due to menopause. She does not have intermenstrual bleeding.  She does not have vasomotor sx.   Sex activity: occas with single partner, contraception - post menopausal status. She does have vaginal dryness, uses estrace crm with sx relief. Doesn't need RF this yr.  Last Pap: 03/07/18  Results were: no abnormalities. Neg HPV DNA 2017. Pt likes yearly paps. Hx of STDs: HSV, HPV. She takes valtrex prn outbreaks, which are rare. Needs Rx RF this yr.  Last mammogram: 10/12/18  Results were: normal--routine follow-up in 12 months There is a FH of breast cancer in her PGM, genetic testing not indicated. There is no FH of ovarian cancer. The patient does not do self-breast exams.  Colonoscopy: colonoscopy 2018 without abnormalities.  Repeat due after 5 years due to [place] of poyps.  DEXA: 2012--osteopenia in hip.  Tobacco use: The patient denies current or previous tobacco use. Alcohol use: social No drug use. Exercise: occas active.   She does get adequate calcium and Vitamin D in her diet.  Normal lipids 2017. Due for rechk 2020.  Past Medical History:  Diagnosis Date  . Allergy   . BV (bacterial vaginosis)   . 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 Diverticulosis   . Genital herpes 2016  . Glaucoma (increased eye pressure)   . Osteopenia   . 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 COLONOSCOPY  2018   neg; repeat in 5 yrs due to [place] polyps  . CRYOTHERAPY  1979  . LIPOSUCTION    . TONSILLECTOMY      Family History  Problem Relation Age of Onset  . Breast cancer Paternal Grandmother 5  . Hypertension Mother   . Cervical cancer Mother 75        no chemo  . Cancer Mother   . Colon polyps Sister   . Hypertension Brother     Social History   Socioeconomic History  . Marital status: Single    Spouse name: Not on file  . Number of children: Not on file  . Years of education: Not on file  . Highest education level: Not on file  Occupational History  . Not on file  Social Needs  . Financial resource strain: Not hard at all  . Food insecurity    Worry: Never true    Inability: Never true  . Transportation needs    Medical: No    Non-medical: No  Tobacco Use  . Smoking status: Never Smoker  . Smokeless tobacco: Never Used  Substance and Sexual Activity  . Alcohol use: Yes    Comment: occasional  . Drug use: No  . Sexual activity: Yes    Birth control/protection: Post-menopausal  Lifestyle  . Physical activity    Days per week: 3 days    Minutes per session: 60 min  . Stress: Very much  Relationships  . Social connections    Talks on phone: More than three times a week    Gets together: Three times a week    Attends religious service: More than 4 times per year  Active member of club or organization: Yes    Attends meetings of clubs or organizations: More than 4 times per year    Relationship status: Divorced  . Intimate partner violence    Fear of current or ex partner: No    Emotionally abused: No    Physically abused: No    Forced sexual activity: No  Other Topics Concern  . Not on file  Social History Narrative  . Not on file    Current Meds  Medication Sig  . aspirin (ASPIRIN CHILDRENS) 81 MG chewable tablet Chew 1 tablet (81 mg total) by mouth daily.  Marland Kitchen azelastine (ASTELIN) 0.1 % nasal spray ONE SPRAY EACH NOSTRIL TWICE A DAY AS NEEDED FOR ALLERGIES  . cetirizine (ZYRTEC) 10 MG tablet Take 1 tablet (10 mg total) by mouth daily.  . ciclopirox (PENLAC) 8 % solution Apply topically at bedtime. Apply over nail and surrounding skin. Apply daily over previous coat. After seven (7) days, may  remove with alcohol and continue cycle. Do this for 4 weeks.  . COMBIGAN 0.2-0.5 % ophthalmic solution INSTILL 1 DROP BY OPHTHALMIC ROUTE EVERY 12 HOURS INTO RIGHT EYE  . estradiol (ESTRACE VAGINAL) 0.1 MG/GM vaginal cream Place 1 Applicatorful vaginally once a week.   . latanoprost (XALATAN) 0.005 % ophthalmic solution INSTILL 1 DROP INTO BOTH EYES EVERY DAY IN THE EVENING  . omeprazole (PRILOSEC) 20 MG capsule TAKE 30 MINUTES PRIOR TO FIRST MEAL OF DAY AS NEEDED FOR REFLUX  . RESTASIS 0.05 % ophthalmic emulsion INSTILL 1 DROP BY OPHTHALMIC ROUTE EVERY 12 HOURS OU      ROS:  Review of Systems  Constitutional: Negative for fatigue, fever and unexpected weight change.  Respiratory: Negative for cough, shortness of breath and wheezing.   Cardiovascular: Negative for chest pain, palpitations and leg swelling.  Gastrointestinal: Negative for blood in stool, constipation, diarrhea, nausea and vomiting.  Endocrine: Negative for cold intolerance, heat intolerance and polyuria.  Genitourinary: Negative for dyspareunia, dysuria, flank pain, frequency, genital sores, hematuria, menstrual problem, pelvic pain, urgency, vaginal bleeding, vaginal discharge and vaginal pain.  Musculoskeletal: Negative for arthralgias, back pain, joint swelling and myalgias.  Skin: Negative for rash.  Neurological: Negative for dizziness, syncope, light-headedness, numbness and headaches.  Hematological: Negative for adenopathy.  Psychiatric/Behavioral: Negative for agitation, confusion, sleep disturbance and suicidal ideas. The patient is not nervous/anxious.     Objective: BP 110/80   Ht 5' (1.524 m)   Wt 142 lb (64.4 kg)   BMI 27.73 kg/m    Physical Exam Constitutional:      Appearance: She is well-developed.  Genitourinary:     Vulva, vagina, uterus, right adnexa and left adnexa normal.     No vulval lesion or tenderness noted.     No vaginal discharge, erythema or tenderness.     No cervical motion  tenderness or polyp.     Uterus is not enlarged or tender.     No right or left adnexal mass present.     Right adnexa not tender.     Left adnexa not tender.  Neck:     Musculoskeletal: Normal range of motion.     Thyroid: No thyromegaly.  Cardiovascular:     Rate and Rhythm: Normal rate and regular rhythm.     Heart sounds: Normal heart sounds. No murmur.  Pulmonary:     Effort: Pulmonary effort is normal.     Breath sounds: Normal breath sounds.  Chest:     Breasts:  Right: No mass, nipple discharge, skin change or tenderness.        Left: No mass, nipple discharge, skin change or tenderness.  Abdominal:     Palpations: Abdomen is soft.     Tenderness: There is no abdominal tenderness. There is no guarding.  Musculoskeletal: Normal range of motion.  Neurological:     General: No focal deficit present.     Mental Status: She is alert and oriented to person, place, and time.     Cranial Nerves: No cranial nerve deficit.  Skin:    General: Skin is warm and dry.  Psychiatric:        Mood and Affect: Mood normal.        Behavior: Behavior normal.        Thought Content: Thought content normal.        Judgment: Judgment normal.  Vitals signs reviewed.     Assessment/Plan:  Encounter for annual routine gynecological examination  Cervical cancer screening - Plan: Cytology - PAP  Encounter for screening mammogram for malignant neoplasm of breast; pt current on mammo  Herpes simplex vulvovaginitis - Rx RF valtrex. Takes prn. - Plan: valACYclovir (VALTREX) 500 MG tablet   Vaginal atrophy--uses estrace crm prn. Doesn't need RF today. Will call prn  Blood tests for routine general physical examination - Plan: Comprehensive metabolic panel, Lipid panel  Screening cholesterol level - Plan: Lipid panel   Meds ordered this encounter  Medications  . valACYclovir (VALTREX) 500 MG tablet    Sig: Take 1 tablet (500 mg total) by mouth 2 (two) times daily for 3 days. Prn sx     Dispense:  30 tablet    Refill:  1    Order Specific Question:   Supervising Provider    Answer:   Gae Dry [id number]            GYN counsel mammography screening, adequate intake of calcium and vitamin D, diet and exercise    F/U  Return in about 1 year (around 03/10/2020).  Kisean Rollo B. Rasheida Broden, PA-C 03/11/2019 8:56 AM

## 2019-03-11 ENCOUNTER — Ambulatory Visit (INDEPENDENT_AMBULATORY_CARE_PROVIDER_SITE_OTHER): Payer: 59 | Admitting: Obstetrics and Gynecology

## 2019-03-11 ENCOUNTER — Encounter: Payer: Self-pay | Admitting: Obstetrics and Gynecology

## 2019-03-11 ENCOUNTER — Other Ambulatory Visit: Payer: Self-pay

## 2019-03-11 ENCOUNTER — Other Ambulatory Visit (HOSPITAL_COMMUNITY)
Admission: RE | Admit: 2019-03-11 | Discharge: 2019-03-11 | Disposition: A | Discharge status: Home / Self Care | Payer: PRIVATE HEALTH INSURANCE | Source: Ambulatory Visit | Attending: Obstetrics and Gynecology | Admitting: Obstetrics and Gynecology

## 2019-03-11 VITALS — BP 110/80 | Ht 60.0 in | Wt 142.0 lb

## 2019-03-11 DIAGNOSIS — Z1231 Encounter for screening mammogram for malignant neoplasm of breast: Secondary | ICD-10-CM

## 2019-03-11 DIAGNOSIS — Z124 Encounter for screening for malignant neoplasm of cervix: Secondary | ICD-10-CM | POA: Diagnosis present

## 2019-03-11 DIAGNOSIS — Z01419 Encounter for gynecological examination (general) (routine) without abnormal findings: Secondary | ICD-10-CM

## 2019-03-11 DIAGNOSIS — Z0000 Encounter for general adult medical examination without abnormal findings: Secondary | ICD-10-CM

## 2019-03-11 DIAGNOSIS — Z13220 Encounter for screening for lipoid disorders: Secondary | ICD-10-CM

## 2019-03-11 DIAGNOSIS — N952 Postmenopausal atrophic vaginitis: Secondary | ICD-10-CM | POA: Insufficient documentation

## 2019-03-11 DIAGNOSIS — A6004 Herpesviral vulvovaginitis: Secondary | ICD-10-CM | POA: Insufficient documentation

## 2019-03-11 MED ORDER — VALACYCLOVIR HCL 500 MG PO TABS: 500.0000 mg | ORAL_TABLET | Freq: Two times a day (BID) | ORAL | 1 refills | Status: DC

## 2019-03-11 NOTE — Patient Instructions (Signed)
I value your feedback and entrusting us with your care. If you get a [facility] patient survey, I would appreciate you taking the time to let us know about your experience today. Thank you! 

## 2019-03-12 ENCOUNTER — Other Ambulatory Visit: Payer: Self-pay | Admitting: Obstetrics and Gynecology

## 2019-03-12 ENCOUNTER — Encounter: Payer: Self-pay | Admitting: Obstetrics and Gynecology

## 2019-03-12 ENCOUNTER — Other Ambulatory Visit: Payer: Self-pay

## 2019-03-12 DIAGNOSIS — A6004 Herpesviral vulvovaginitis: Secondary | ICD-10-CM

## 2019-03-12 LAB — COMPREHENSIVE METABOLIC PANEL
ALT: 11 IU/L (ref 0–32)
AST: 11 IU/L (ref 0–40)
Albumin/Globulin Ratio: 1.5 (ref 1.2–2.2)
Albumin: 4 g/dL (ref 3.8–4.8)
Alkaline Phosphatase: 81 IU/L (ref 39–117)
BUN/Creatinine Ratio: 24 (ref 12–28)
BUN: 17 mg/dL (ref 8–27)
Bilirubin Total: 0.3 mg/dL (ref 0.0–1.2)
CO2: 26 mmol/L (ref 20–29)
Calcium: 9 mg/dL (ref 8.7–10.3)
Chloride: 105 mmol/L (ref 96–106)
Creatinine, Ser: 0.7 mg/dL (ref 0.57–1.00)
GFR calc Af Amer: 107 mL/min/{1.73_m2} (ref >59)
GFR calc non Af Amer: 93 mL/min/{1.73_m2} (ref >59)
Globulin, Total: 2.6 g/dL (ref 1.5–4.5)
Glucose: 89 mg/dL (ref 65–99)
Potassium: 4.4 mmol/L (ref 3.5–5.2)
Sodium: 142 mmol/L (ref 134–144)
Total Protein: 6.6 g/dL (ref 6.0–8.5)

## 2019-03-12 LAB — LIPID PANEL
Chol/HDL Ratio: 3.3 ratio (ref 0.0–4.4)
Cholesterol, Total: 174 mg/dL (ref 100–199)
HDL: 52 mg/dL (ref >39)
LDL Chol Calc (NIH): 97 mg/dL (ref 0–99)
Triglycerides: 141 mg/dL (ref 0–149)
VLDL Cholesterol Cal: 25 mg/dL (ref 5–40)

## 2019-03-12 MED ORDER — VALACYCLOVIR HCL 500 MG PO TABS: 500.0000 mg | ORAL_TABLET | Freq: Two times a day (BID) | ORAL | 0 refills | Status: DC

## 2019-03-12 NOTE — Progress Notes (Signed)
Rx change to valtrex for 90 days supply for ins.

## 2019-03-14 LAB — CYTOLOGY - PAP
Adequacy: ABSENT
Diagnosis: NEGATIVE

## 2019-03-20 ENCOUNTER — Other Ambulatory Visit: Payer: Self-pay | Admitting: Obstetrics and Gynecology

## 2019-03-20 DIAGNOSIS — A6004 Herpesviral vulvovaginitis: Secondary | ICD-10-CM

## 2019-05-15 ENCOUNTER — Ambulatory Visit: Payer: PRIVATE HEALTH INSURANCE | Admitting: Internal Medicine

## 2019-06-14 ENCOUNTER — Other Ambulatory Visit: Payer: Self-pay | Admitting: Obstetrics and Gynecology

## 2019-06-14 DIAGNOSIS — A6004 Herpesviral vulvovaginitis: Secondary | ICD-10-CM

## 2019-08-20 ENCOUNTER — Encounter: Payer: Self-pay | Admitting: Obstetrics and Gynecology

## 2019-08-30 ENCOUNTER — Other Ambulatory Visit: Payer: Self-pay | Admitting: Obstetrics and Gynecology

## 2019-08-30 ENCOUNTER — Encounter: Payer: Self-pay | Admitting: Obstetrics and Gynecology

## 2019-08-30 MED ORDER — ESTRADIOL 0.1 MG/GM VA CREA: 1.0000 g | TOPICAL_CREAM | VAGINAL | 0 refills | Status: DC

## 2019-08-30 NOTE — Progress Notes (Signed)
Rx RF estrace crm till annual 

## 2019-09-09 ENCOUNTER — Other Ambulatory Visit: Payer: Self-pay | Admitting: Obstetrics and Gynecology

## 2019-09-09 DIAGNOSIS — Z1231 Encounter for screening mammogram for malignant neoplasm of breast: Secondary | ICD-10-CM

## 2019-10-01 ENCOUNTER — Ambulatory Visit: Payer: PRIVATE HEALTH INSURANCE | Admitting: Podiatry

## 2019-10-01 ENCOUNTER — Other Ambulatory Visit: Payer: Self-pay

## 2019-10-01 DIAGNOSIS — G5762 Lesion of plantar nerve, left lower limb: Secondary | ICD-10-CM

## 2019-10-01 NOTE — Patient Instructions (Signed)
Pre-Operative Instructions  Congratulations, you have decided to take an important step towards improving your quality of life.  You can be assured that the doctors and staff at [facility] will be with you every step of the way.  Here are some important things you should know:  1. Plan to be at the surgery center/hospital at least 1 (one) hour prior to your scheduled time, unless otherwise directed by the surgical center/hospital staff.  You must have a responsible adult accompany you, remain during the surgery and drive you home.  Make sure you have directions to the surgical center/hospital to ensure you arrive on time. 2. If you are having surgery at [facility] or [facility] hospitals, you will need a copy of your medical history and physical form from your family physician within one month prior to the date of surgery. We will give you a form for your primary physician to complete.  3. We make every effort to accommodate the date you request for surgery.  However, there are times where surgery dates or times have to be moved.  We will contact you as soon as possible if a change in schedule is required.   4. No aspirin/ibuprofen for one week before surgery.  If you are on aspirin, any non-steroidal anti-inflammatory medications (Mobic, Aleve, Ibuprofen) should not be taken seven (7) days prior to your surgery.  You make take Tylenol for pain prior to surgery.  5. Medications - If you are taking daily heart and blood pressure medications, seizure, reflux, allergy, asthma, anxiety, pain or diabetes medications, make sure you notify the surgery center/hospital before the day of surgery so they can tell you which medications you should take or avoid the day of surgery. 6. No food or drink after midnight the night before surgery unless directed otherwise by surgical center/hospital staff. 7. No alcoholic beverages 24-hours prior to surgery.  No smoking 24-hours prior or 24-hours after  surgery. 8. Wear loose pants or shorts. They should be loose enough to fit over bandages, boots, and casts. 9. Don't wear slip-on shoes. Sneakers are preferred. 10. Bring your boot with you to the surgery center/hospital.  Also bring crutches or a walker if your physician has prescribed it for you.  If you do not have this equipment, it will be provided for you after surgery. 11. If you have not been contacted by the surgery center/hospital by the day before your surgery, call to confirm the date and time of your surgery. 12. Leave-time from work may vary depending on the type of surgery you have.  Appropriate arrangements should be made prior to surgery with your employer. 13. Prescriptions will be provided immediately following surgery by your doctor.  Fill these as soon as possible after surgery and take the medication as directed. Pain medications will not be refilled on weekends and must be approved by the doctor. 14. Remove nail polish on the operative foot and avoid getting pedicures prior to surgery. 15. Wash the night before surgery.  The night before surgery wash the foot and leg well with water and the antibacterial soap provided. Be sure to pay special attention to beneath the toenails and in between the toes.  Wash for at least three (3) minutes. Rinse thoroughly with water and dry well with a towel.  Perform this wash unless told not to do so by your physician.  Enclosed: 1 Ice pack (please put in freezer the night before surgery)   1 Hibiclens skin cleaner     Pre-op instructions  If you have any questions regarding the instructions, please do not hesitate to call our office.  [place]: [address], [place] [place] [address] -- [phone]  [place]: [address] [place], [place] [address] -- [phone]  [place]: [address], [place], [place] [address] -- [phone]   Website: [email or web address] 

## 2019-10-03 NOTE — Progress Notes (Signed)
   HPI: 64 year old female presenting today for follow up evaluation of a neuroma of the 3rd interspace of the left foot. She reports worsening pain. She notes associated numbness intermittently. Walking increases the pain. She has been wearing good shoe gear as directed. Patient is here for further evaluation and treatment.   Past Medical History:  Diagnosis Date  . Allergy   . BV (bacterial vaginosis)   . 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 Diverticulosis   . Genital herpes 2016  . Glaucoma (increased eye pressure)   . Osteopenia   . 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  Dermatology: Skin is warm, dry and supple bilateral lower extremities. Negative for open lesions or macerations.  Vascular: Palpable pedal pulses bilaterally. No edema or erythema noted. Capillary refill within normal limits.  Neurological: Epicritic and protective threshold grossly intact bilaterally.   Musculoskeletal Exam: Sharp pain with palpation of the 3rd interspace and lateral compression of the metatarsal heads consistent with neuroma.  Positive [place] sign with loadbearing of the forefoot.  Assessment: 1.  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 Patient was evaluated. 2. Today we discussed the conservative versus surgical management of the presenting pathology. The patient opts for surgical management. All possible complications and details of the procedure were explained. All patient questions were answered. No guarantees were expressed or implied. 3. Authorization for surgery was initiated today. Surgery will consist of excision Morton's neuroma third interspace left.  4. Post op shoe dispensed.  5. Return to clinic one week post op.   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    Edrick Kins, DPM [facility]  Dr. Edrick Kins, [place]                                        [place], [place] [address]                Office [phone]  Fax [phone]

## 2019-10-23 ENCOUNTER — Telehealth: Payer: Self-pay

## 2019-10-23 ENCOUNTER — Other Ambulatory Visit: Payer: Self-pay

## 2019-10-23 ENCOUNTER — Ambulatory Visit
Admission: RE | Admit: 2019-10-23 | Discharge: 2019-10-23 | Disposition: A | Discharge status: Home / Self Care | Payer: 59 | Source: Ambulatory Visit | Attending: Obstetrics and Gynecology | Admitting: Obstetrics and Gynecology

## 2019-10-23 DIAGNOSIS — Z1231 Encounter for screening mammogram for malignant neoplasm of breast: Secondary | ICD-10-CM | POA: Diagnosis not present

## 2019-10-23 NOTE — Telephone Encounter (Signed)
DOS 11/07/19  NEURECTOMY 3RD LT - 28080  [employer] EFFECTIVE DATE - 05/09/2018  PLAN DEDUCTIBLE - $1000.00 W/$400.63 MET OUT OF POCKET - $5500.00 W $440.63 MET COPAY $0.00 COINSURANCE - 70%  PER FAX FROM [employer] NO PRECERT REQUIRED

## 2019-10-24 ENCOUNTER — Encounter: Payer: Self-pay | Admitting: Obstetrics and Gynecology

## 2019-11-04 ENCOUNTER — Encounter: Payer: Self-pay | Admitting: Obstetrics and Gynecology

## 2019-11-04 ENCOUNTER — Other Ambulatory Visit: Payer: Self-pay | Admitting: Obstetrics and Gynecology

## 2019-11-04 DIAGNOSIS — N761 Subacute and chronic vaginitis: Secondary | ICD-10-CM

## 2019-11-04 MED ORDER — CLOTRIMAZOLE-BETAMETHASONE 1-0.05 % EX CREA: TOPICAL_CREAM | CUTANEOUS | 0 refills | Status: DC

## 2019-11-04 NOTE — Progress Notes (Signed)
Rx RF lotrisone crm

## 2019-11-06 ENCOUNTER — Telehealth: Payer: Self-pay | Admitting: *Deleted

## 2019-11-06 NOTE — Telephone Encounter (Signed)
Having surgery on foot tomorrow in the [place] office at 12:15 and wondering if okay to eat prior. Also using a steroid cream under breast for rash and "is that okay to continue with or should I stop that before surgery?"

## 2019-11-07 ENCOUNTER — Telehealth: Payer: Self-pay | Admitting: *Deleted

## 2019-11-07 ENCOUNTER — Other Ambulatory Visit: Payer: Self-pay | Admitting: Podiatry

## 2019-11-07 DIAGNOSIS — G5762 Lesion of plantar nerve, left lower limb: Secondary | ICD-10-CM

## 2019-11-07 MED ORDER — IBUPROFEN 800 MG PO TABS: 800.0000 mg | ORAL_TABLET | Freq: Three times a day (TID) | ORAL | 1 refills | Status: DC

## 2019-11-07 MED ORDER — OXYCODONE-ACETAMINOPHEN 5-325 MG PO TABS: 1.0000 | ORAL_TABLET | Freq: Four times a day (QID) | ORAL | 0 refills | Status: DC | PRN

## 2019-11-07 NOTE — Telephone Encounter (Signed)
Pt states she had surgery today with Dr. Amalia Hailey and the dressing is too tight and she is miserable.

## 2019-11-07 NOTE — Telephone Encounter (Signed)
error 

## 2019-11-07 NOTE — Telephone Encounter (Signed)
I spoke with pt and informed that probably the ride home with the foot below her heart caused some of the discomfort, but to be seated and remover the boot, open-ended sock and ace wrap only, leaving the gauze in place and there may be blood on it, but that was okay because it was hers and sterile, I told pt to elevate the surgery foot for 15 minutes, but if the pain worsened to dangle it for 15 minutes this being the only time it was okay to have the foot below her heart and after 15 minutes either way, place the foot level with her hip and beginning at the toes rewrap the ace wrap down the foot and up the leg, reapply the sock and boot, may leave open to ice if not getting up or going to bed. Pt states understanding.

## 2019-11-07 NOTE — Progress Notes (Signed)
PRN postop 

## 2019-11-13 ENCOUNTER — Encounter: Payer: Self-pay | Admitting: Podiatry

## 2019-11-15 ENCOUNTER — Ambulatory Visit (INDEPENDENT_AMBULATORY_CARE_PROVIDER_SITE_OTHER): Payer: PRIVATE HEALTH INSURANCE | Admitting: Podiatry

## 2019-11-15 ENCOUNTER — Encounter: Payer: Self-pay | Admitting: Podiatry

## 2019-11-15 ENCOUNTER — Other Ambulatory Visit: Payer: Self-pay

## 2019-11-15 DIAGNOSIS — Z9889 Other specified postprocedural states: Secondary | ICD-10-CM

## 2019-11-15 DIAGNOSIS — G5762 Lesion of plantar nerve, left lower limb: Secondary | ICD-10-CM

## 2019-11-15 NOTE — Progress Notes (Signed)
   Subjective:  Patient presents today status post Morton's neurectomy third interspace left foot. DOS: 11/07/2019.  Patient states that she is doing very well.  She only has slight pain.  She is no longer taking any pain medications.  She takes ibuprofen as needed.  Overall she is doing very well and she has been weightbearing in the surgical shoe as directed.  No new complaints at this time  Past Medical History:  Diagnosis Date  . Allergy   . BV (bacterial vaginosis)   . 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 Diverticulosis   . Genital herpes 2016  . Glaucoma (increased eye pressure)   . Osteopenia   . 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  Skin incisions appear to be well coapted with sutures and staples intact. No sign of infectious process noted. No dehiscence. No active bleeding noted.  Minimal edema noted to the surgical extremity.  Assessment: 1. s/p Morton's neurectomy third interspace left foot. DOS: 11/07/2019   Plan of Care:  1. Patient was evaluated.  2.  Dressings changed today.  Recommend triple antibiotic ointment and a light Band-Aid daily to the foot incision site 3.  Continue weightbearing in the postoperative shoe as directed 4.  Return to clinic in 1 week for suture removal   Edrick Kins, DPM [facility]  Dr. Edrick Kins, [address]                                        [place], [place] [address]                Office [phone]  Fax [phone]

## 2019-11-22 ENCOUNTER — Encounter: Payer: Self-pay | Admitting: Podiatry

## 2019-11-22 ENCOUNTER — Ambulatory Visit (INDEPENDENT_AMBULATORY_CARE_PROVIDER_SITE_OTHER): Payer: PRIVATE HEALTH INSURANCE | Admitting: Podiatry

## 2019-11-22 ENCOUNTER — Other Ambulatory Visit: Payer: Self-pay

## 2019-11-22 DIAGNOSIS — Z9889 Other specified postprocedural states: Secondary | ICD-10-CM

## 2019-11-22 DIAGNOSIS — G5762 Lesion of plantar nerve, left lower limb: Secondary | ICD-10-CM

## 2019-11-22 NOTE — Progress Notes (Signed)
   Subjective:  Patient presents today status post Morton's neurectomy third interspace left foot. DOS: 11/07/2019.  Patient states that she has been doing much better.  She did have some minimal pain yesterday however today she states that she does not have any pain.  She has been wearing the postoperative shoe as directed.  No new complaints at this time  Past Medical History:  Diagnosis Date  . Allergy   . BV (bacterial vaginosis)   . 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 Diverticulosis   . Genital herpes 2016  . Glaucoma (increased eye pressure)   . Osteopenia   . 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  Skin incisions appear to be well coapted with sutures intact. No sign of infectious process noted. No dehiscence. No active bleeding noted.  Minimal edema noted to the surgical extremity.  Assessment: 1. s/p Morton's neurectomy third interspace left foot. DOS: 11/07/2019   Plan of Care:  1. Patient was evaluated.  2.  Sutures removed today Three.  Patient may transition out of the postsurgical shoe into good supportive sneakers Four.  Return to clinic in 1 month for final follow-up  Edrick Kins, DPM [facility]  Dr. Edrick Kins, [place]                                        [place], [place] [address]                Office [phone]  Fax [phone]

## 2019-12-10 ENCOUNTER — Encounter: Payer: PRIVATE HEALTH INSURANCE | Admitting: Podiatry

## 2019-12-24 ENCOUNTER — Ambulatory Visit (INDEPENDENT_AMBULATORY_CARE_PROVIDER_SITE_OTHER): Payer: PRIVATE HEALTH INSURANCE | Admitting: Podiatry

## 2019-12-24 ENCOUNTER — Other Ambulatory Visit: Payer: Self-pay

## 2019-12-24 DIAGNOSIS — G5762 Lesion of plantar nerve, left lower limb: Secondary | ICD-10-CM

## 2019-12-24 DIAGNOSIS — Z9889 Other specified postprocedural states: Secondary | ICD-10-CM

## 2019-12-24 NOTE — Progress Notes (Signed)
   Subjective:  Patient presents today status post Morton's neurectomy third interspace left foot. DOS: 11/07/2019.  Patient has been doing very well with no pain.  She has been for activity in her tennis shoes.  No new complaints at this time  Past Medical History:  Diagnosis Date  . Allergy   . BV (bacterial vaginosis)   . 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 Diverticulosis   . Genital herpes 2016  . Glaucoma (increased eye pressure)   . Osteopenia   . 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  Dermatology: Skin is cool, dry and supple bilateral lower extremities. Negative for open lesions or macerations.  Skin incisions to the left foot neuroma site has healed completely  Vascular: Palpable pedal pulses bilaterally. No edema or erythema noted. Capillary refill within normal limits.  Neurological: Epicritic and protective threshold grossly intact bilaterally.   Musculoskeletal Exam: All pedal and ankle joints range of motion within normal limits bilateral. Muscle strength 5/5 in all groups bilateral.   Assessment: 1. s/p Morton's neurectomy third interspace left foot. DOS: 11/07/2019   Plan of Care:  1. Patient was evaluated.  2.  Patient may resume full activity no restrictions 3.  Recommend good supportive sneakers or shoes 4.  Return to clinic as needed  Edrick Kins, DPM [facility]  Dr. Edrick Kins, [address]                                        [place], [place] [address]                Office [phone]  Fax [phone]

## 2020-01-31 ENCOUNTER — Encounter: Payer: Self-pay | Admitting: Obstetrics and Gynecology

## 2020-03-23 DIAGNOSIS — R87810 Cervical high risk human papillomavirus (HPV) DNA test positive: Secondary | ICD-10-CM | POA: Insufficient documentation

## 2020-03-23 NOTE — Progress Notes (Signed)
PCP: Chad Cordial, PA-C   Chief Complaint  Patient presents with  . Gynecologic Exam    HPI:      Ms. LENZIE MONTESANO is a 64 y.o. G1P1001 who LMP was No LMP recorded. Patient is postmenopausal., presents today for her annual examination.  Her menses are absent due to menopause. She does not have intermenstrual bleeding.  She does not have vasomotor sx.   Sex activity: occas with single partner, contraception - post menopausal status. She does have vaginal dryness, uses estrace crm with sx relief. Needs RF this yr.  Last Pap: 03/11/19  Results were: no abnormalities. Neg HPV DNA 2017. Pt likes yearly paps. Hx of STDs: HSV, HPV. She takes valtrex prn outbreaks, which are rare.   Last mammogram: 10/23/19  Results were: normal--routine follow-up in 12 months There is a FH of breast cancer in her PGM, genetic testing not indicated. There is no FH of ovarian cancer. The patient does not do self-breast exams.  Colonoscopy: colonoscopy 2018 without abnormalities.  Repeat due after 5 years due to [place] of poyps.  DEXA: 2012--osteopenia in hip.  Tobacco use: The patient denies current or previous tobacco use. Alcohol use: social No drug use. Exercise: occas active.   She does get adequate calcium and Vitamin D in her diet.  Normal lipids 11/20  Past Medical History:  Diagnosis Date  . Allergy   . BV (bacterial vaginosis)   . 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 Diverticulosis   . Genital herpes 2016  . Glaucoma (increased eye pressure)   . Osteopenia   . 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 COLONOSCOPY  2018   neg; repeat in 5 yrs due to [place] polyps  . CRYOTHERAPY  1979  . LIPOSUCTION    . TONSILLECTOMY      Family History  Problem Relation Age of Onset  . Breast cancer Paternal Grandmother 6  . Hypertension Mother   . Cervical cancer Mother 51       no chemo  . Cancer Mother   .  Colon polyps Sister   . Hypertension Brother     Social History   Socioeconomic History  . Marital status: Single    Spouse name: Not on file  . Number of children: Not on file  . Years of education: Not on file  . Highest education level: Not on file  Occupational History  . Not on file  Tobacco Use  . Smoking status: Never Smoker  . Smokeless tobacco: Never Used  Vaping Use  . Vaping Use: Never used  Substance and Sexual Activity  . Alcohol use: Yes    Comment: occasional  . Drug use: No  . Sexual activity: Yes    Birth control/protection: Post-menopausal  Other Topics Concern  . Not on file  Social History Narrative  . Not on file   Social Determinants of Health   Financial Resource Strain:   . Difficulty of Paying Living Expenses: Not on file  Food Insecurity:   . Worried About Charity fundraiser in the Last Year: Not on file  . Ran Out of Food in the Last Year: Not on file  Transportation Needs:   . Lack of Transportation (Medical): Not on file  . Lack of Transportation (Non-Medical): Not on file  Physical Activity:   . Days of Exercise per Week: Not on file  . Minutes  of Exercise per Session: Not on file  Stress:   . Feeling of Stress : Not on file  Social Connections:   . Frequency of Communication with Friends and Family: Not on file  . Frequency of Social Gatherings with Friends and Family: Not on file  . Attends Religious Services: Not on file  . Active Member of Clubs or Organizations: Not on file  . Attends Archivist Meetings: Not on file  . Marital Status: Not on file  Intimate Partner Violence:   . Fear of Current or Ex-Partner: Not on file  . Emotionally Abused: Not on file  . Physically Abused: Not on file  . Sexually Abused: Not on file    Current Meds  Medication Sig  . azelastine (ASTELIN) 0.1 % nasal spray ONE SPRAY EACH NOSTRIL TWICE A DAY AS NEEDED FOR ALLERGIES  . cetirizine (ZYRTEC) 10 MG tablet Take 1 tablet (10 mg  total) by mouth daily.  . COMBIGAN 0.2-0.5 % ophthalmic solution INSTILL 1 DROP BY OPHTHALMIC ROUTE EVERY 12 HOURS INTO RIGHT EYE  . latanoprost (XALATAN) 0.005 % ophthalmic solution INSTILL 1 DROP INTO BOTH EYES EVERY DAY IN THE EVENING  . methocarbamol (ROBAXIN) 500 MG tablet methocarbamol 500 mg tablet  Take 1 tablet twice a day by oral route as needed for 14 days.  Marland Kitchen omeprazole (PRILOSEC) 20 MG capsule TAKE 30 MINUTES PRIOR TO FIRST MEAL OF DAY AS NEEDED FOR REFLUX  . RESTASIS 0.05 % ophthalmic emulsion INSTILL 1 DROP BY OPHTHALMIC ROUTE EVERY 12 HOURS OU  . [DISCONTINUED] estradiol (ESTRACE VAGINAL) 0.1 MG/GM vaginal cream Place 1 g vaginally once a week.  . estradiol (ESTRACE VAGINAL) 0.1 MG/GM vaginal cream Place 1 g vaginally once a week.      ROS:  Review of Systems  Constitutional: Negative for fatigue, fever and unexpected weight change.  Respiratory: Negative for cough, shortness of breath and wheezing.   Cardiovascular: Negative for chest pain, palpitations and leg swelling.  Gastrointestinal: Negative for blood in stool, constipation, diarrhea, nausea and vomiting.  Endocrine: Negative for cold intolerance, heat intolerance and polyuria.  Genitourinary: Negative for dyspareunia, dysuria, flank pain, frequency, genital sores, hematuria, menstrual problem, pelvic pain, urgency, vaginal bleeding, vaginal discharge and vaginal pain.  Musculoskeletal: Negative for arthralgias, back pain, joint swelling and myalgias.  Skin: Negative for rash.  Neurological: Negative for dizziness, syncope, light-headedness, numbness and headaches.  Hematological: Negative for adenopathy.  Psychiatric/Behavioral: Negative for agitation, confusion, sleep disturbance and suicidal ideas. The patient is not nervous/anxious.     Objective: BP 100/70   Ht 5' (1.524 m)   Wt 136 lb (61.7 kg)   BMI 26.56 kg/m    Physical Exam Constitutional:      Appearance: She is well-developed.    Genitourinary:     Vulva, vagina, uterus, right adnexa and left adnexa normal.     No vulval lesion or tenderness noted.     No vaginal discharge, erythema or tenderness.     No cervical motion tenderness or polyp.     Uterus is not enlarged or tender.     No right or left adnexal mass present.     Right adnexa not tender.     Left adnexa not tender.  Neck:     Thyroid: No thyromegaly.  Cardiovascular:     Rate and Rhythm: Normal rate and regular rhythm.     Heart sounds: Normal heart sounds. No murmur heard.   Pulmonary:     Effort: Pulmonary  effort is normal.     Breath sounds: Normal breath sounds.  Chest:     Breasts:        Right: No mass, nipple discharge, skin change or tenderness.        Left: No mass, nipple discharge, skin change or tenderness.  Abdominal:     Palpations: Abdomen is soft.     Tenderness: There is no abdominal tenderness. There is no guarding.  Musculoskeletal:        General: Normal range of motion.     Cervical back: Normal range of motion.  Neurological:     General: No focal deficit present.     Mental Status: She is alert and oriented to person, place, and time.     Cranial Nerves: No cranial nerve deficit.  Skin:    General: Skin is warm and dry.  Psychiatric:        Mood and Affect: Mood normal.        Behavior: Behavior normal.        Thought Content: Thought content normal.        Judgment: Judgment normal.  Vitals reviewed.     Assessment/Plan: Encounter for annual routine gynecological examination  Cervical cancer screening - Plan: Cytology - PAP  Screening for HPV (human papillomavirus) - Plan: Cytology - PAP  Cervical high risk human papillomavirus (HPV) DNA test positive - Plan: Cytology - PAP  Encounter for screening mammogram for malignant neoplasm of breast - Plan: MM 3D SCREEN BREAST BILATERAL; pt to sched mammo 6/22  Herpes simplex vulvovaginitis--will RF valtrex prn  Vaginal atrophy - Plan: estradiol (ESTRACE  VAGINAL) 0.1 MG/GM vaginal cream; Rx RF eRxd. F/u prn.   Meds ordered this encounter  Medications  . estradiol (ESTRACE VAGINAL) 0.1 MG/GM vaginal cream    Sig: Place 1 g vaginally once a week.    Dispense:  42.5 g    Refill:  1    Order Specific Question:   Supervising Provider    Answer:   Gae Dry [[id number]]            GYN counsel mammography screening, adequate intake of calcium and vitamin D, diet and exercise    F/U  Return in about 1 year (around 03/24/2021).  Justun Anaya B. Judah Carchi, PA-C 03/24/2020 9:42 AM

## 2020-03-24 ENCOUNTER — Ambulatory Visit (INDEPENDENT_AMBULATORY_CARE_PROVIDER_SITE_OTHER): Payer: PRIVATE HEALTH INSURANCE | Admitting: Obstetrics and Gynecology

## 2020-03-24 ENCOUNTER — Encounter: Payer: Self-pay | Admitting: Obstetrics and Gynecology

## 2020-03-24 ENCOUNTER — Other Ambulatory Visit: Payer: Self-pay

## 2020-03-24 ENCOUNTER — Other Ambulatory Visit (HOSPITAL_COMMUNITY)
Admission: RE | Admit: 2020-03-24 | Discharge: 2020-03-24 | Disposition: A | Discharge status: Home / Self Care | Payer: 59 | Source: Ambulatory Visit | Attending: Obstetrics and Gynecology | Admitting: Obstetrics and Gynecology

## 2020-03-24 VITALS — BP 100/70 | Ht 60.0 in | Wt 136.0 lb

## 2020-03-24 DIAGNOSIS — Z1151 Encounter for screening for human papillomavirus (HPV): Secondary | ICD-10-CM | POA: Diagnosis present

## 2020-03-24 DIAGNOSIS — Z124 Encounter for screening for malignant neoplasm of cervix: Secondary | ICD-10-CM | POA: Diagnosis not present

## 2020-03-24 DIAGNOSIS — Z1231 Encounter for screening mammogram for malignant neoplasm of breast: Secondary | ICD-10-CM

## 2020-03-24 DIAGNOSIS — Z01419 Encounter for gynecological examination (general) (routine) without abnormal findings: Secondary | ICD-10-CM

## 2020-03-24 DIAGNOSIS — R87810 Cervical high risk human papillomavirus (HPV) DNA test positive: Secondary | ICD-10-CM | POA: Insufficient documentation

## 2020-03-24 DIAGNOSIS — N952 Postmenopausal atrophic vaginitis: Secondary | ICD-10-CM

## 2020-03-24 DIAGNOSIS — A6004 Herpesviral vulvovaginitis: Secondary | ICD-10-CM

## 2020-03-24 MED ORDER — ESTRADIOL 0.1 MG/GM VA CREA: 1.0000 g | TOPICAL_CREAM | VAGINAL | 1 refills | Status: DC

## 2020-03-24 NOTE — Patient Instructions (Signed)
I value your feedback and entrusting us with your care. If you get a [facility] patient survey, I would appreciate you taking the time to let us know about your experience today. Thank you! ° °As of April 18, 2019, your lab results will be released to your MyChart immediately, before I even have a chance to see them. Please give me time to review them and contact you if there are any abnormalities. Thank you for your patience.  ° °[facility]: [phone] ° ° ° °

## 2020-03-26 LAB — CYTOLOGY - PAP
Adequacy: ABSENT
Comment: NEGATIVE
Diagnosis: NEGATIVE
High risk HPV: NEGATIVE

## 2020-03-29 ENCOUNTER — Encounter: Payer: Self-pay | Admitting: Obstetrics and Gynecology

## 2020-03-30 ENCOUNTER — Other Ambulatory Visit: Payer: Self-pay | Admitting: Obstetrics and Gynecology

## 2020-03-30 DIAGNOSIS — A6004 Herpesviral vulvovaginitis: Secondary | ICD-10-CM

## 2020-03-30 MED ORDER — VALACYCLOVIR HCL 500 MG PO TABS: 500.0000 mg | ORAL_TABLET | Freq: Two times a day (BID) | ORAL | 0 refills | Status: DC

## 2020-04-01 ENCOUNTER — Encounter: Payer: Self-pay | Admitting: Obstetrics and Gynecology

## 2020-04-01 NOTE — Telephone Encounter (Signed)
Can you pls call the pharmacy to make sure they will give her 80? Thx.

## 2020-04-03 ENCOUNTER — Other Ambulatory Visit: Payer: Self-pay | Admitting: Obstetrics and Gynecology

## 2020-04-03 DIAGNOSIS — A6004 Herpesviral vulvovaginitis: Secondary | ICD-10-CM

## 2020-04-03 MED ORDER — VALACYCLOVIR HCL 500 MG PO TABS: 500.0000 mg | ORAL_TABLET | Freq: Every day | ORAL | 0 refills | Status: DC

## 2020-04-03 NOTE — Progress Notes (Signed)
Rx change valtrex to QD for 90 day supply for insurance. Pt takes prn.

## 2020-04-03 NOTE — Telephone Encounter (Signed)
Valtrex Rx changed to QD, even though pt takes prn

## 2020-06-30 ENCOUNTER — Other Ambulatory Visit: Payer: Self-pay | Admitting: Obstetrics and Gynecology

## 2020-06-30 DIAGNOSIS — A6004 Herpesviral vulvovaginitis: Secondary | ICD-10-CM

## 2020-06-30 NOTE — Telephone Encounter (Signed)
Pls advise.  

## 2020-09-08 ENCOUNTER — Other Ambulatory Visit: Payer: Self-pay | Admitting: Obstetrics and Gynecology

## 2020-09-08 DIAGNOSIS — Z1231 Encounter for screening mammogram for malignant neoplasm of breast: Secondary | ICD-10-CM

## 2020-09-18 ENCOUNTER — Encounter: Payer: Self-pay | Admitting: Obstetrics and Gynecology

## 2020-10-26 ENCOUNTER — Ambulatory Visit: Payer: PRIVATE HEALTH INSURANCE

## 2020-11-02 ENCOUNTER — Ambulatory Visit
Admission: RE | Admit: 2020-11-02 | Discharge: 2020-11-02 | Disposition: A | Discharge status: Home / Self Care | Payer: 59 | Source: Ambulatory Visit | Attending: Obstetrics and Gynecology | Admitting: Obstetrics and Gynecology

## 2020-11-02 ENCOUNTER — Other Ambulatory Visit: Payer: Self-pay

## 2020-11-02 DIAGNOSIS — Z1231 Encounter for screening mammogram for malignant neoplasm of breast: Secondary | ICD-10-CM | POA: Insufficient documentation

## 2020-11-13 ENCOUNTER — Encounter: Payer: Self-pay | Admitting: Obstetrics and Gynecology

## 2020-12-03 ENCOUNTER — Encounter: Payer: Self-pay | Admitting: Obstetrics and Gynecology

## 2021-02-11 ENCOUNTER — Ambulatory Visit: Payer: PRIVATE HEALTH INSURANCE | Admitting: Adult Health

## 2021-04-03 ENCOUNTER — Emergency Department: Payer: 59

## 2021-04-03 ENCOUNTER — Other Ambulatory Visit: Payer: Self-pay

## 2021-04-03 ENCOUNTER — Encounter: Payer: Self-pay | Admitting: Emergency Medicine

## 2021-04-03 DIAGNOSIS — M79601 Pain in right arm: Secondary | ICD-10-CM | POA: Insufficient documentation

## 2021-04-03 DIAGNOSIS — M25511 Pain in right shoulder: Secondary | ICD-10-CM | POA: Insufficient documentation

## 2021-04-03 DIAGNOSIS — Y92410 Unspecified street and highway as the place of occurrence of the external cause: Secondary | ICD-10-CM | POA: Diagnosis not present

## 2021-04-03 DIAGNOSIS — Z5321 Procedure and treatment not carried out due to patient leaving prior to being seen by health care provider: Secondary | ICD-10-CM | POA: Diagnosis not present

## 2021-04-03 DIAGNOSIS — R519 Headache, unspecified: Secondary | ICD-10-CM | POA: Insufficient documentation
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
## 2021-04-04 ENCOUNTER — Emergency Department
Admission: EM | Admit: 2021-04-04 | Discharge: 2021-04-04 | Disposition: A | Discharge status: Home / Self Care | Payer: 59 | Attending: Emergency Medicine | Admitting: Emergency Medicine

## 2021-04-04 NOTE — ED Notes (Addendum)
Pt left due to wait times, plans to follow up with PCP, pt ambulatory without difficulty.

## 2021-04-05 ENCOUNTER — Other Ambulatory Visit (HOSPITAL_COMMUNITY)
Admission: RE | Admit: 2021-04-05 | Discharge: 2021-04-05 | Disposition: A | Discharge status: Home / Self Care | Payer: 59 | Source: Ambulatory Visit | Attending: Obstetrics and Gynecology | Admitting: Obstetrics and Gynecology

## 2021-04-05 ENCOUNTER — Encounter: Payer: Self-pay | Admitting: Obstetrics and Gynecology

## 2021-04-05 ENCOUNTER — Other Ambulatory Visit: Payer: Self-pay

## 2021-04-05 ENCOUNTER — Ambulatory Visit (INDEPENDENT_AMBULATORY_CARE_PROVIDER_SITE_OTHER): Payer: PRIVATE HEALTH INSURANCE | Admitting: Obstetrics and Gynecology

## 2021-04-05 VITALS — BP 130/80 | Ht 61.5 in | Wt 140.0 lb

## 2021-04-05 DIAGNOSIS — N952 Postmenopausal atrophic vaginitis: Secondary | ICD-10-CM

## 2021-04-05 DIAGNOSIS — A6004 Herpesviral vulvovaginitis: Secondary | ICD-10-CM

## 2021-04-05 DIAGNOSIS — Z1231 Encounter for screening mammogram for malignant neoplasm of breast: Secondary | ICD-10-CM

## 2021-04-05 DIAGNOSIS — Z124 Encounter for screening for malignant neoplasm of cervix: Secondary | ICD-10-CM

## 2021-04-05 DIAGNOSIS — Z01419 Encounter for gynecological examination (general) (routine) without abnormal findings: Secondary | ICD-10-CM | POA: Diagnosis not present

## 2021-04-05 MED ORDER — VALACYCLOVIR HCL 500 MG PO TABS: 500.0000 mg | ORAL_TABLET | Freq: Every day | ORAL | 0 refills | Status: DC

## 2021-04-05 MED ORDER — ESTRADIOL 0.1 MG/GM VA CREA: 1.0000 g | TOPICAL_CREAM | VAGINAL | 1 refills | Status: DC

## 2021-04-05 NOTE — Progress Notes (Signed)
PCP: Chad Cordial, PA-C   Chief Complaint  Patient presents with   Gynecologic Exam    No concerns    HPI:      [name] is a 65 y.o. G1P1001 who LMP was No LMP recorded. Patient is postmenopausal., presents today for her annual examination.  Her menses are absent due to menopause. She does not have PMB.  She does not have vasomotor sx.   Sex activity: occas with single partner, contraception - post menopausal status. She does have vaginal dryness, uses estrace crm with some sx relief, but not using lubricants. Needs RF this yr.  Last Pap: 03/24/20  Results were: no abnormalities. Neg HPV DNA. Pt likes yearly paps. Hx of STDs: HSV, HPV on pap 2010. She takes valtrex prn outbreaks, which are rare. Needs 90 day Rx.  Last mammogram: 11/02/20  Results were: normal--routine follow-up in 12 months There is a FH of breast cancer in her PGM, genetic testing not indicated. There is no FH of ovarian cancer. The patient does not do self-breast exams.  Colonoscopy: colonoscopy 2018 without abnormalities.  Repeat due after 5 years due to [place] of poyps.  DEXA: 2012--osteopenia in hip/8/22 at [facility] with osteopenia in spine and hip  Tobacco use: The patient denies current or previous tobacco use. Alcohol use: social No drug use. Exercise: occas active.   She does get adequate calcium and Vitamin D in her diet.  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    Past Surgical History:  Procedure Laterality Date   COLONOSCOPY  2018   neg; repeat in 5 yrs due to [place] polyps   CRYOTHERAPY  1979   LIPOSUCTION     TONSILLECTOMY      Family History  Problem Relation Age of Onset   Breast cancer Paternal Grandmother 4    Hypertension Mother    Cervical cancer Mother 34       no chemo   Cancer Mother    Colon polyps Sister    Hypertension Brother     Social History   Socioeconomic History   Marital status: Single    Spouse name: Not on file   Number of children: Not on file   Years of education: Not on file   Highest education level: Not on file  Occupational History   Not on file  Tobacco Use   Smoking status: Never   Smokeless tobacco: Never  Vaping Use   Vaping Use: Never used  Substance and Sexual Activity   Alcohol use: Yes    Comment: occasional   Drug use: No   Sexual activity: Yes    Birth control/protection: Post-menopausal  Other Topics Concern   Not on file  Social History Narrative   Not on file   Social Determinants of Health   Financial Resource Strain: Not on file  Food Insecurity: Not on file  Transportation Needs: Not on file  Physical Activity: Not on file  Stress: Not on file  Social Connections: Not on file  Intimate Partner Violence: Not on file    Current Meds  Medication Sig   azelastine (ASTELIN) 0.1 % nasal spray ONE SPRAY EACH NOSTRIL TWICE A  DAY AS NEEDED FOR ALLERGIES   Bioflavonoid Products (BIOFLEX) TABS Take by mouth.   Calcium Carb-Cholecalciferol 500-10 MG-MCG TABS Take 1 tablet by mouth daily.   cetirizine (ZYRTEC) 10 MG tablet Take 1 tablet (10 mg total) by mouth daily.   COMBIGAN 0.2-0.5 % ophthalmic solution INSTILL 1 DROP BY OPHTHALMIC ROUTE EVERY 12 HOURS INTO RIGHT EYE   cyclobenzaprine (FLEXERIL) 5 MG tablet Take 5 mg by mouth 3 (three) times daily as needed.   fluticasone (FLONASE) 50 MCG/ACT nasal spray fluticasone propionate 50 mcg/actuation nasal spray,suspension  USE 2 SPRAYS IN EACH NOSTRIL ONCE DAILY   latanoprost (XALATAN) 0.005 % ophthalmic solution INSTILL 1 DROP INTO BOTH EYES EVERY DAY IN THE EVENING   meloxicam (MOBIC) 15 MG tablet Take 15 mg by mouth daily as needed.   methocarbamol (ROBAXIN) 500 MG tablet methocarbamol 500  mg tablet  Take 1 tablet twice a day by oral route as needed for 14 days.   Multiple Vitamin (MULTI-VITAMIN) tablet Take 1 tablet by mouth daily.   omeprazole (PRILOSEC) 20 MG capsule TAKE 30 MINUTES PRIOR TO FIRST MEAL OF DAY AS NEEDED FOR REFLUX   RESTASIS 0.05 % ophthalmic emulsion INSTILL 1 DROP BY OPHTHALMIC ROUTE EVERY 12 HOURS OU   tretinoin (RETIN-A) 0.1 % cream tretinoin 0.1 % topical cream  APPLY A PEA SIZE AMOUNT TO DRY FACE EVERY EVENING   triamcinolone lotion (KENALOG) 0.1 % Apply topically.   [DISCONTINUED] estradiol (ESTRACE VAGINAL) 0.1 MG/GM vaginal cream Place 1 g vaginally once a week.   [DISCONTINUED] valACYclovir (VALTREX) 500 MG tablet Take 1 tablet (500 mg total) by mouth daily. Prn sx      ROS:  Review of Systems  Constitutional:  Negative for fatigue, fever and unexpected weight change.  Respiratory:  Negative for cough, shortness of breath and wheezing.   Cardiovascular:  Negative for chest pain, palpitations and leg swelling.  Gastrointestinal:  Negative for blood in stool, constipation, diarrhea, nausea and vomiting.  Endocrine: Negative for cold intolerance, heat intolerance and polyuria.  Genitourinary:  Negative for dyspareunia, dysuria, flank pain, frequency, genital sores, hematuria, menstrual problem, pelvic pain, urgency, vaginal bleeding, vaginal discharge and vaginal pain.  Musculoskeletal:  Positive for arthralgias. Negative for back pain, joint swelling and myalgias.  Skin:  Negative for rash.  Neurological:  Negative for dizziness, syncope, light-headedness, numbness and headaches.  Hematological:  Negative for adenopathy.  Psychiatric/Behavioral:  Negative for agitation, confusion, sleep disturbance and suicidal ideas. The patient is not nervous/anxious.    Objective: BP 130/80   Ht 5' 1.5" (1.562 m)   Wt 140 lb (63.5 kg)   BMI 26.02 kg/m    Physical Exam Constitutional:      Appearance: She is well-developed.  Genitourinary:     Vulva  normal.     Right Labia: No rash, tenderness or lesions.    Left Labia: No tenderness, lesions or rash.    No vaginal discharge, erythema or tenderness.      Right Adnexa: not tender and no mass present.    Left Adnexa: not tender and no mass present.    No cervical motion tenderness, friability or polyp.     Uterus is not enlarged or tender.  Breasts:    Right: No mass, nipple discharge, skin change or tenderness.     Left: No mass, nipple discharge, skin change or tenderness.  Neck:     Thyroid: No thyromegaly.  Cardiovascular:     Rate and Rhythm: Normal rate and regular rhythm.  Heart sounds: Normal heart sounds. No murmur heard. Pulmonary:     Effort: Pulmonary effort is normal.     Breath sounds: Normal breath sounds.  Abdominal:     Palpations: Abdomen is soft.     Tenderness: There is no abdominal tenderness. There is no guarding or rebound.  Musculoskeletal:        General: Normal range of motion.     Cervical back: Normal range of motion.  Lymphadenopathy:     Cervical: No cervical adenopathy.  Neurological:     General: No focal deficit present.     Mental Status: She is alert and oriented to person, place, and time.     Cranial Nerves: No cranial nerve deficit.  Skin:    General: Skin is warm and dry.  Psychiatric:        Mood and Affect: Mood normal.        Behavior: Behavior normal.        Thought Content: Thought content normal.        Judgment: Judgment normal.  Vitals reviewed.    Assessment/Plan: Encounter for annual routine gynecological examination  Cervical cancer screening - Plan: Cytology - PAP; per pt request  Encounter for screening mammogram for malignant neoplasm of breast - Plan: MM 3D SCREEN BREAST BILATERAL; pt to sched mammo  Herpes simplex vulvovaginitis - Plan: valACYclovir (VALTREX) 500 MG tablet; Rx RF. Pt takes prn  Vaginal atrophy - Plan: estradiol (ESTRACE VAGINAL) 0.1 MG/GM vaginal cream; Rx RF. Add lubricants.   Meds  ordered this encounter  Medications   valACYclovir (VALTREX) 500 MG tablet    Sig: Take 1 tablet (500 mg total) by mouth daily. Prn sx    Dispense:  90 tablet    Refill:  0    Order Specific Question:   Supervising Provider    Answer:   Gae Dry [[id number]]   estradiol (ESTRACE VAGINAL) 0.1 MG/GM vaginal cream    Sig: Place 1 g vaginally once a week.    Dispense:  42.5 g    Refill:  1    Order Specific Question:   Supervising Provider    Answer:   Gae Dry [[id number]]            GYN counsel mammography screening, adequate intake of calcium and vitamin D, diet and exercise    F/U  Return in about 1 year (around 04/05/2022).  Kellie Woolever B. Braddock Servellon, PA-C 04/05/2021 9:46 AM

## 2021-04-05 NOTE — Patient Instructions (Signed)
I value your feedback and you entrusting us with your care. If you get a [facility] patient survey, I would appreciate you taking the time to let us know about your experience today. Thank you!  [facility]: [phone]      

## 2021-04-06 ENCOUNTER — Encounter: Payer: Self-pay | Admitting: Obstetrics and Gynecology

## 2021-04-06 LAB — CYTOLOGY - PAP: Diagnosis: NEGATIVE

## 2021-07-01 ENCOUNTER — Other Ambulatory Visit: Payer: Self-pay | Admitting: Physician Assistant

## 2021-07-01 DIAGNOSIS — M7541 Impingement syndrome of right shoulder: Secondary | ICD-10-CM

## 2021-07-02 ENCOUNTER — Other Ambulatory Visit: Payer: Self-pay | Admitting: Obstetrics and Gynecology

## 2021-07-02 DIAGNOSIS — A6004 Herpesviral vulvovaginitis: Secondary | ICD-10-CM

## 2021-07-05 ENCOUNTER — Ambulatory Visit
Admission: RE | Admit: 2021-07-05 | Discharge: 2021-07-05 | Disposition: A | Discharge status: Home / Self Care | Payer: 59 | Source: Ambulatory Visit | Attending: Physician Assistant | Admitting: Physician Assistant

## 2021-07-05 DIAGNOSIS — M7541 Impingement syndrome of right shoulder: Secondary | ICD-10-CM

## 2021-07-15 ENCOUNTER — Other Ambulatory Visit: Payer: Self-pay | Admitting: Surgery

## 2021-07-19 ENCOUNTER — Other Ambulatory Visit: Payer: Self-pay

## 2021-07-19 ENCOUNTER — Encounter
Admission: RE | Admit: 2021-07-19 | Discharge: 2021-07-19 | Disposition: A | Discharge status: Home / Self Care | Payer: 59 | Source: Ambulatory Visit | Attending: Surgery | Admitting: Surgery

## 2021-07-19 DIAGNOSIS — R011 Cardiac murmur, unspecified: Secondary | ICD-10-CM

## 2021-07-19 HISTORY — DX: Unspecified osteoarthritis, unspecified site: M19.90

## 2021-07-19 HISTORY — DX: Gastro-esophageal reflux disease without esophagitis: K21.9

## 2021-07-19 HISTORY — DX: Palpitations: R00.2

## 2021-07-19 HISTORY — DX: Cardiac murmur, unspecified: R01.1

## 2021-07-19 HISTORY — DX: Headache, unspecified: R51.9

## 2021-07-19 MED ORDER — LACTATED RINGERS IV SOLN: INTRAVENOUS | Status: DC

## 2021-07-19 MED ORDER — CHLORHEXIDINE GLUCONATE 0.12 % MT SOLN: 15.0000 mL | Freq: Once | OROMUCOSAL | Status: AC

## 2021-07-19 MED ORDER — CEFAZOLIN SODIUM-DEXTROSE 2-4 GM/100ML-% IV SOLN
2.0000 g | INTRAVENOUS | Status: AC
  Administered 2021-07-20: 2 g via INTRAVENOUS

## 2021-07-19 MED ORDER — ORAL CARE MOUTH RINSE: 15.0000 mL | Freq: Once | OROMUCOSAL | Status: AC

## 2021-07-19 NOTE — Patient Instructions (Signed)
Your procedure is scheduled on:07-20-21 Tuesday ?Report to the Registration Desk on the 1st floor of the [facility].Then proceed to the 2nd floor Surgery Desk in the [facility]  ?To find out your arrival time, please call [phone] between 1PM - 3PM on:07-19-21 Monday ? ?REMEMBER: ?Instructions that are not followed completely may result in serious medical risk, up to and including death; or upon the discretion of your surgeon and anesthesiologist your surgery may need to be rescheduled. ? ?Do not eat food after midnight the night before surgery.  ?No gum chewing, lozengers or hard candies. ? ?You may however, drink CLEAR liquids up to 2 hours before you are scheduled to arrive for your surgery. Do not drink anything within 2 hours of your scheduled arrival time. ? ?Clear liquids include: ?- water  ?- apple juice without pulp ?- gatorade (not RED colors) ?- black coffee or tea (Do NOT add milk or creamers to the coffee or tea) ?Do NOT drink anything that is not on this list. ? ?TAKE THESE MEDICATIONS THE MORNING OF SURGERY WITH A SIP OF WATER: ?-omeprazole (PRILOSEC)-take one the night before and one on the morning of surgery - helps to prevent nausea after surgery.) ? ?One week prior to surgery: ?Stop Anti-inflammatories (NSAIDS) such as Meloxicam (mobic) Advil, Aleve, Ibuprofen, Motrin, Naproxen, Naprosyn and Aspirin based products such as Excedrin, Goodys Powder, BC Powder.You may however, continue to take Tylenol if needed for pain up until the day of surgery. (Last dose of Meloxicam was on 07-18-21) ? ?Stop ANY OVER THE COUNTER supplements/vitamins until after surgery-(Last had Multiple Vitamin , Glucosamine , Calcium, and VITAMIN C on 07-17-21) ? ?No Alcohol for 24 hours before or after surgery. ? ?No Smoking including e-cigarettes for 24 hours prior to surgery.  ?No chewable tobacco products for at least 6 hours prior to surgery.  ?No nicotine patches on the day of surgery. ? ?Do not use any  "recreational" drugs for at least a week prior to your surgery.  ?Please be advised that the combination of cocaine and anesthesia may have negative outcomes, up to and including death. ?If you test positive for cocaine, your surgery will be cancelled. ? ?On the morning of surgery brush your teeth with toothpaste and water, you may rinse your mouth with mouthwash if you wish. ?Do not swallow any toothpaste or mouthwash. ? ?Do not wear jewelry, make-up, hairpins, clips or nail polish. ? ?Do not wear lotions, powders, or perfumes.  ? ?Do not shave body from the neck down 48 hours prior to surgery just in case you cut yourself which could leave a site for infection.  ?Also, freshly shaved skin may become irritated if using the CHG soap. ? ?Contact lenses, hearing aids and dentures may not be worn into surgery. ? ?Do not bring valuables to the hospital. [facility] is not responsible for any missing/lost belongings or valuables.  ? ?Notify your doctor if there is any change in your medical condition (cold, fever, infection). ? ?Wear comfortable clothing (specific to your surgery type) to the hospital. ? ?After surgery, you can help prevent lung complications by doing breathing exercises.  ?Take deep breaths and cough every 1-2 hours. Your doctor may order a device called an Incentive Spirometer to help you take deep breaths. ?When coughing or sneezing, hold a pillow firmly against your incision with both hands. This is called ?splinting.? Doing this helps protect your incision. It also decreases belly discomfort. ? ?If you are being admitted to  the hospital overnight, leave your suitcase in the car. ?After surgery it may be brought to your room. ? ?If you are being discharged the day of surgery, you will not be allowed to drive home. ?You will need a responsible adult (18 years or older) to drive you home and stay with you that night.  ? ?If you are taking public transportation, you will need to have a responsible adult  (18 years or older) with you. ?Please confirm with your physician that it is acceptable to use public transportation.  ? ?Please call the [facility] Dept. at [phone] if you have any questions about these instructions. ? ?Surgery Visitation Policy: ? ?Patients undergoing a surgery or procedure may have one family member or support person with them as long as that person is not COVID-19 positive or experiencing its symptoms.  ?That person may remain in the waiting area during the procedure and may rotate out with other people ?

## 2021-07-20 ENCOUNTER — Other Ambulatory Visit: Payer: Self-pay

## 2021-07-20 ENCOUNTER — Encounter
Admission: RE | Disposition: A | Discharge status: Home / Self Care | Payer: Self-pay | Source: Home / Self Care | Attending: Surgery

## 2021-07-20 ENCOUNTER — Ambulatory Visit: Payer: 59

## 2021-07-20 ENCOUNTER — Ambulatory Visit
Admission: RE | Admit: 2021-07-20 | Discharge: 2021-07-20 | Disposition: A | Discharge status: Home / Self Care | Payer: 59 | Attending: Surgery | Admitting: Surgery

## 2021-07-20 ENCOUNTER — Encounter: Payer: Self-pay | Admitting: Surgery

## 2021-07-20 DIAGNOSIS — S46011A Strain of muscle(s) and tendon(s) of the rotator cuff of right shoulder, initial encounter: Secondary | ICD-10-CM | POA: Diagnosis not present

## 2021-07-20 DIAGNOSIS — N952 Postmenopausal atrophic vaginitis: Secondary | ICD-10-CM

## 2021-07-20 DIAGNOSIS — R011 Cardiac murmur, unspecified: Secondary | ICD-10-CM

## 2021-07-20 DIAGNOSIS — M25811 Other specified joint disorders, right shoulder: Secondary | ICD-10-CM | POA: Insufficient documentation

## 2021-07-20 DIAGNOSIS — J301 Allergic rhinitis due to pollen: Secondary | ICD-10-CM

## 2021-07-20 DIAGNOSIS — R52 Pain, unspecified: Secondary | ICD-10-CM

## 2021-07-20 DIAGNOSIS — K219 Gastro-esophageal reflux disease without esophagitis: Secondary | ICD-10-CM | POA: Diagnosis not present

## 2021-07-20 DIAGNOSIS — M25511 Pain in right shoulder: Secondary | ICD-10-CM | POA: Diagnosis present

## 2021-07-20 DIAGNOSIS — M19011 Primary osteoarthritis, right shoulder: Secondary | ICD-10-CM | POA: Insufficient documentation

## 2021-07-20 DIAGNOSIS — Z419 Encounter for procedure for purposes other than remedying health state, unspecified: Secondary | ICD-10-CM

## 2021-07-20 LAB — BASIC METABOLIC PANEL
Anion gap: 5 (ref 5–15)
BUN: 19 mg/dL (ref 8–23)
CO2: 29 mmol/L (ref 22–32)
Calcium: 8.8 mg/dL — ABNORMAL LOW (ref 8.9–10.3)
Chloride: 103 mmol/L (ref 98–111)
Creatinine, Ser: 0.76 mg/dL (ref 0.44–1.00)
GFR, Estimated: >60 mL/min (ref >60)
Glucose, Bld: 96 mg/dL (ref 70–99)
Potassium: 4.2 mmol/L (ref 3.5–5.1)
Sodium: 137 mmol/L (ref 135–145)

## 2021-07-20 SURGERY — SHOULDER ARTHROSCOPY WITH SUBACROMIAL DECOMPRESSION, ROTATOR CUFF REPAIR AND BICEP TENDON REPAIR
Anesthesia: General | Site: Shoulder | Laterality: Right

## 2021-07-20 MED ORDER — ONDANSETRON HCL 4 MG/2ML IJ SOLN
4.0000 mg | Freq: Four times a day (QID) | INTRAMUSCULAR | Status: DC | PRN
Start: 2021-07-20 — End: 2021-07-20

## 2021-07-20 MED ORDER — PROPOFOL 10 MG/ML IV BOLUS
INTRAVENOUS | Status: DC | PRN
  Administered 2021-07-20: 140 mg via INTRAVENOUS

## 2021-07-20 MED ORDER — SODIUM CHLORIDE 0.9 % IV SOLN: INTRAVENOUS | Status: DC

## 2021-07-20 MED ORDER — ONDANSETRON HCL 4 MG PO TABS: 4.0000 mg | ORAL_TABLET | Freq: Four times a day (QID) | ORAL | Status: DC | PRN

## 2021-07-20 MED ORDER — FENTANYL CITRATE (PF) 100 MCG/2ML IJ SOLN
INTRAMUSCULAR | Status: AC
Start: 2021-07-20 — End: ?
  Filled 2021-07-20: qty 2

## 2021-07-20 MED ORDER — CEFAZOLIN SODIUM-DEXTROSE 2-4 GM/100ML-% IV SOLN
INTRAVENOUS | Status: AC
  Filled 2021-07-20: qty 100

## 2021-07-20 MED ORDER — PHENYLEPHRINE HCL (PRESSORS) 10 MG/ML IV SOLN
INTRAVENOUS | Status: DC | PRN
  Administered 2021-07-20 (×2): 200 ug via INTRAVENOUS
  Administered 2021-07-20: 50 ug via INTRAVENOUS
  Administered 2021-07-20: 100 ug via INTRAVENOUS

## 2021-07-20 MED ORDER — FENTANYL CITRATE (PF) 100 MCG/2ML IJ SOLN: 25.0000 ug | INTRAMUSCULAR | Status: DC | PRN

## 2021-07-20 MED ORDER — FENTANYL CITRATE (PF) 100 MCG/2ML IJ SOLN
INTRAMUSCULAR | Status: DC | PRN
  Administered 2021-07-20: 25 ug via INTRAVENOUS
  Administered 2021-07-20: 50 ug via INTRAVENOUS
  Administered 2021-07-20: 25 ug via INTRAVENOUS

## 2021-07-20 MED ORDER — ESTRADIOL 0.1 MG/GM VA CREA: 1.0000 g | TOPICAL_CREAM | Freq: Every day | VAGINAL | Status: AC | PRN

## 2021-07-20 MED ORDER — METOCLOPRAMIDE HCL 5 MG/ML IJ SOLN: 5.0000 mg | Freq: Three times a day (TID) | INTRAMUSCULAR | Status: DC | PRN

## 2021-07-20 MED ORDER — BUPIVACAINE LIPOSOME 1.3 % IJ SUSP
INTRAMUSCULAR | Status: AC
  Filled 2021-07-20: qty 10

## 2021-07-20 MED ORDER — OXYCODONE HCL 5 MG/5ML PO SOLN: 5.0000 mg | Freq: Once | ORAL | Status: DC | PRN

## 2021-07-20 MED ORDER — MIDAZOLAM HCL 2 MG/2ML IJ SOLN
INTRAMUSCULAR | Status: DC | PRN
  Administered 2021-07-20: 1 mg via INTRAVENOUS

## 2021-07-20 MED ORDER — CETIRIZINE HCL 10 MG PO TABS: 10.0000 mg | ORAL_TABLET | Freq: Every evening | ORAL | Status: AC | PRN

## 2021-07-20 MED ORDER — ONDANSETRON HCL 4 MG/2ML IJ SOLN
INTRAMUSCULAR | Status: AC
  Filled 2021-07-20: qty 2

## 2021-07-20 MED ORDER — CHLORHEXIDINE GLUCONATE 0.12 % MT SOLN
OROMUCOSAL | Status: AC
  Administered 2021-07-20: 15 mL via OROMUCOSAL
  Filled 2021-07-20: qty 15

## 2021-07-20 MED ORDER — BUPIVACAINE-EPINEPHRINE 0.5% -1:200000 IJ SOLN
INTRAMUSCULAR | Status: DC | PRN
Start: 2021-07-20 — End: 2021-07-20
  Administered 2021-07-20: 30 mL

## 2021-07-20 MED ORDER — PHENYLEPHRINE HCL-NACL 20-0.9 MG/250ML-% IV SOLN
INTRAVENOUS | Status: DC | PRN
  Administered 2021-07-20: 40 ug/min via INTRAVENOUS

## 2021-07-20 MED ORDER — BUPIVACAINE-EPINEPHRINE (PF) 0.5% -1:200000 IJ SOLN
INTRAMUSCULAR | Status: AC
  Filled 2021-07-20: qty 30

## 2021-07-20 MED ORDER — PROPOFOL 10 MG/ML IV BOLUS
INTRAVENOUS | Status: AC
  Filled 2021-07-20: qty 20

## 2021-07-20 MED ORDER — LIDOCAINE HCL (PF) 1 % IJ SOLN
INTRAMUSCULAR | Status: AC
  Filled 2021-07-20: qty 5

## 2021-07-20 MED ORDER — BUPIVACAINE LIPOSOME 1.3 % IJ SUSP
INTRAMUSCULAR | Status: DC | PRN
  Administered 2021-07-20: 10 mL via PERINEURAL

## 2021-07-20 MED ORDER — DROPERIDOL 2.5 MG/ML IJ SOLN
0.6250 mg | Freq: Once | INTRAMUSCULAR | Status: DC | PRN
  Filled 2021-07-20: qty 2

## 2021-07-20 MED ORDER — MIDAZOLAM HCL 2 MG/2ML IJ SOLN: 1.0000 mg | Freq: Once | INTRAMUSCULAR | Status: DC

## 2021-07-20 MED ORDER — ACETAMINOPHEN 10 MG/ML IV SOLN: 1000.0000 mg | Freq: Once | INTRAVENOUS | Status: DC | PRN

## 2021-07-20 MED ORDER — CEFAZOLIN SODIUM-DEXTROSE 2-4 GM/100ML-% IV SOLN: 2.0000 g | Freq: Four times a day (QID) | INTRAVENOUS | Status: DC

## 2021-07-20 MED ORDER — LIDOCAINE HCL (CARDIAC) PF 100 MG/5ML IV SOSY
PREFILLED_SYRINGE | INTRAVENOUS | Status: DC | PRN
  Administered 2021-07-20: 80 mg via INTRAVENOUS

## 2021-07-20 MED ORDER — BUPIVACAINE HCL (PF) 0.5 % IJ SOLN
INTRAMUSCULAR | Status: DC | PRN
  Administered 2021-07-20: 10 mL

## 2021-07-20 MED ORDER — OXYCODONE HCL 5 MG PO TABS: 5.0000 mg | ORAL_TABLET | Freq: Once | ORAL | Status: DC | PRN

## 2021-07-20 MED ORDER — EPINEPHRINE PF 1 MG/ML IJ SOLN
INTRAMUSCULAR | Status: AC
Start: 2021-07-20 — End: ?
  Filled 2021-07-20: qty 2

## 2021-07-20 MED ORDER — ONDANSETRON HCL 4 MG/2ML IJ SOLN
INTRAMUSCULAR | Status: DC | PRN
  Administered 2021-07-20: 4 mg via INTRAVENOUS

## 2021-07-20 MED ORDER — METOCLOPRAMIDE HCL 10 MG PO TABS: 5.0000 mg | ORAL_TABLET | Freq: Three times a day (TID) | ORAL | Status: DC | PRN

## 2021-07-20 MED ORDER — ROCURONIUM BROMIDE 10 MG/ML (PF) SYRINGE
PREFILLED_SYRINGE | INTRAVENOUS | Status: AC
  Filled 2021-07-20: qty 10

## 2021-07-20 MED ORDER — LIDOCAINE HCL (PF) 1 % IJ SOLN
INTRAMUSCULAR | Status: DC | PRN
  Administered 2021-07-20: 2 mL

## 2021-07-20 MED ORDER — MIDAZOLAM HCL 2 MG/2ML IJ SOLN: 2.0000 mg | Freq: Once | INTRAMUSCULAR | Status: AC

## 2021-07-20 MED ORDER — MIDAZOLAM HCL 2 MG/2ML IJ SOLN
INTRAMUSCULAR | Status: AC
  Filled 2021-07-20: qty 2

## 2021-07-20 MED ORDER — OXYCODONE HCL 5 MG PO TABS: 5.0000 mg | ORAL_TABLET | ORAL | Status: DC | PRN

## 2021-07-20 MED ORDER — BUPIVACAINE HCL (PF) 0.5 % IJ SOLN
INTRAMUSCULAR | Status: AC
  Filled 2021-07-20: qty 10

## 2021-07-20 MED ORDER — MIDAZOLAM HCL 2 MG/2ML IJ SOLN
INTRAMUSCULAR | Status: AC
  Administered 2021-07-20: 2 mg via INTRAVENOUS
  Filled 2021-07-20: qty 2

## 2021-07-20 MED ORDER — KETOROLAC TROMETHAMINE 15 MG/ML IJ SOLN
INTRAMUSCULAR | Status: AC
  Filled 2021-07-20: qty 1

## 2021-07-20 MED ORDER — DEXAMETHASONE SODIUM PHOSPHATE 10 MG/ML IJ SOLN
INTRAMUSCULAR | Status: AC
  Filled 2021-07-20: qty 1

## 2021-07-20 MED ORDER — OXYCODONE HCL 5 MG PO TABS: 5.0000 mg | ORAL_TABLET | ORAL | 0 refills | Status: DC | PRN

## 2021-07-20 MED ORDER — KETOROLAC TROMETHAMINE 15 MG/ML IJ SOLN
15.0000 mg | Freq: Once | INTRAMUSCULAR | Status: AC
  Administered 2021-07-20: 15 mg via INTRAVENOUS

## 2021-07-20 MED ORDER — DEXAMETHASONE SODIUM PHOSPHATE 10 MG/ML IJ SOLN
INTRAMUSCULAR | Status: DC | PRN
Start: 2021-07-20 — End: 2021-07-20
  Administered 2021-07-20: 5 mg via INTRAVENOUS

## 2021-07-20 MED ORDER — ROCURONIUM BROMIDE 100 MG/10ML IV SOLN
INTRAVENOUS | Status: DC | PRN
  Administered 2021-07-20: 50 mg via INTRAVENOUS
  Administered 2021-07-20: 30 mg via INTRAVENOUS

## 2021-07-20 MED ORDER — LACTATED RINGERS IV SOLN
INTRAVENOUS | Status: DC | PRN
  Administered 2021-07-20: 3001 mL

## 2021-07-20 MED ORDER — SUGAMMADEX SODIUM 200 MG/2ML IV SOLN
INTRAVENOUS | Status: DC | PRN
  Administered 2021-07-20: 130.6 mg via INTRAVENOUS

## 2021-07-20 SURGICAL SUPPLY — 54 items
ANCHOR ALL-SUT Q-FIX 2.8 (Anchor) ×3 IMPLANT
ANCHOR HEALICOIL REGEN 5.5 (Anchor) ×2 IMPLANT
ANCHOR SUT JK SZ 2 2.9 DBL SL (Anchor) ×1 IMPLANT
BIT DRILL JUGRKNT W/NDL BIT2.9 (DRILL) IMPLANT
BLADE FULL RADIUS 3.5 (BLADE) ×2 IMPLANT
BUR ACROMIONIZER 4.0 (BURR) ×2 IMPLANT
CANNULA SHAVER 8MMX76MM (CANNULA) ×2 IMPLANT
CHLORAPREP W/TINT 26 (MISCELLANEOUS) ×2 IMPLANT
COVER MAYO STAND REUSABLE (DRAPES) ×2 IMPLANT
DILATOR 5.5 THREADED HEALICOIL (MISCELLANEOUS) ×1 IMPLANT
DRILL JUGGERKNOT W/NDL BIT 2.9 (DRILL) ×2
ELECT CAUTERY BLADE 6.4 (BLADE) ×2 IMPLANT
ELECT REM PT RETURN 9FT ADLT (ELECTROSURGICAL) ×2
ELECTRODE REM PT RTRN 9FT ADLT (ELECTROSURGICAL) ×1 IMPLANT
GAUZE SPONGE 4X4 12PLY STRL (GAUZE/BANDAGES/DRESSINGS) ×2 IMPLANT
GAUZE XEROFORM 1X8 LF (GAUZE/BANDAGES/DRESSINGS) ×2 IMPLANT
GLOVE SRG 8 PF TXTR STRL LF DI (GLOVE) ×1 IMPLANT
GLOVE SURG ENC MOIS LTX SZ7.5 (GLOVE) ×4 IMPLANT
GLOVE SURG ENC MOIS LTX SZ8 (GLOVE) ×4 IMPLANT
GLOVE SURG UNDER LTX SZ8 (GLOVE) ×2 IMPLANT
GLOVE SURG UNDER POLY LF SZ8 (GLOVE) ×1
GOWN STRL REUS W/ TWL LRG LVL3 (GOWN DISPOSABLE) ×1 IMPLANT
GOWN STRL REUS W/ TWL XL LVL3 (GOWN DISPOSABLE) ×1 IMPLANT
GOWN STRL REUS W/TWL LRG LVL3 (GOWN DISPOSABLE) ×1
GOWN STRL REUS W/TWL XL LVL3 (GOWN DISPOSABLE) ×1
GRAFT TISS 40X70 3 THK DERM (Tissue) IMPLANT
GRASPER SUT 15 45D LOW PRO (SUTURE) IMPLANT
IV LACTATED RINGER IRRG 3000ML (IV SOLUTION) ×2
IV LR IRRIG 3000ML ARTHROMATIC (IV SOLUTION) ×2 IMPLANT
KIT CANNULA 8X76-LX IN CANNULA (CANNULA) IMPLANT
KIT SUTURE 2.8 Q-FIX DISP (MISCELLANEOUS) ×1 IMPLANT
MANIFOLD NEPTUNE II (INSTRUMENTS) ×4 IMPLANT
MASK FACE SPIDER DISP (MASK) ×2 IMPLANT
MAT ABSORB  FLUID 56X50 GRAY (MISCELLANEOUS) ×1
MAT ABSORB FLUID 56X50 GRAY (MISCELLANEOUS) ×1 IMPLANT
PACK ARTHROSCOPY SHOULDER (MISCELLANEOUS) ×2 IMPLANT
PAD ABD DERMACEA PRESS 5X9 (GAUZE/BANDAGES/DRESSINGS) ×4 IMPLANT
PASSER SUT FIRSTPASS SELF (INSTRUMENTS) ×2 IMPLANT
SLING ARM LRG DEEP (SOFTGOODS) ×1 IMPLANT
SLING ULTRA II LG (MISCELLANEOUS) ×1 IMPLANT
SLING ULTRA II M (MISCELLANEOUS) ×1 IMPLANT
SPONGE T-LAP 18X18 ~~LOC~~+RFID (SPONGE) ×2 IMPLANT
STAPLER SKIN PROX 35W (STAPLE) ×2 IMPLANT
STRAP SAFETY 5IN WIDE (MISCELLANEOUS) ×2 IMPLANT
SUT ETHIBOND 0 MO6 C/R (SUTURE) ×2 IMPLANT
SUT ULTRABRAID 2 COBRAID 38 (SUTURE) ×5 IMPLANT
SUT VIC AB 2-0 CT1 27 (SUTURE) ×2
SUT VIC AB 2-0 CT1 TAPERPNT 27 (SUTURE) ×2 IMPLANT
TAPE MICROFOAM 4IN (TAPE) ×2 IMPLANT
TISSUE ARTHOFLEX THICK 3MM (Tissue) ×2 IMPLANT
TUBING CONNECTING 10 (TUBING) ×2 IMPLANT
TUBING INFLOW SET DBFLO PUMP (TUBING) ×2 IMPLANT
WAND WEREWOLF FLOW 90D (MISCELLANEOUS) ×2 IMPLANT
WATER STERILE IRR 500ML POUR (IV SOLUTION) ×2 IMPLANT

## 2021-07-20 NOTE — Transfer of Care (Signed)
Immediate Anesthesia Transfer of Care Note ? ?Patient: [name] ? ?Procedure(s) Performed: Right shoulder arthroscopy with debridement, decompression, repair of a massive rotator cuff tear, and biceps tenodesis (Right: Shoulder) ? ?Patient Location: PACU ? ?Anesthesia Type:General ? ?Level of Consciousness: awake, alert  and oriented ? ?Airway & Oxygen Therapy: Patient Spontanous Breathing and Patient connected to face mask oxygen ? ?Post-op Assessment: Report given to RN and Post -op Vital signs reviewed and stable ? ?Post vital signs: Reviewed and stable ? ?Last Vitals:  ?Vitals Value Taken Time  ?BP 139/70 07/20/21 1639  ?Temp 36 ?C 07/20/21 1639  ?Pulse 82 07/20/21 1641  ?Resp 20 07/20/21 1641  ?SpO2 99 % 07/20/21 1641  ?Vitals shown include unvalidated device data. ? ?Last Pain: There were no vitals filed for this visit.   ? ?  ? ?Complications: No notable events documented. ?

## 2021-07-20 NOTE — Anesthesia Procedure Notes (Signed)
Anesthesia Regional Block: Interscalene brachial plexus block  ? ?Pre-Anesthetic Checklist: , timeout performed,  Correct Patient, Correct Site, Correct Laterality,  Correct Procedure, Correct Position, site marked,  Risks and benefits discussed,  Surgical consent,  Pre-op evaluation,  At surgeon's request and post-op pain management ? ?Laterality: Upper and Right ? ?Prep: chloraprep     ?  ?Needles:  ?Injection technique: Single-shot ? ?Needle Type: Stimiplex   ? ? ?Needle Length: 9cm  ?Needle Gauge: 22  ? ? ? ?Additional Needles: ? ? ?Procedures:,,,, ultrasound used (permanent image in chart),,    ?Narrative:  ?Start time: 07/20/2021 12:48 PM ?End time: 07/20/2021 12:50 PM ?Injection made incrementally with aspirations every 20 mL. ? ?Performed by: Personally  ?Anesthesiologist: Iran Ouch, MD ? ?Additional Notes: ?Patient consented for risk and benefits of nerve block including but not limited to nerve damage, failed block, bleeding and infection.  Patient voiced understanding. ? ?Functioning IV was confirmed and monitors were applied.  Timeout done prior to procedure and prior to any sedation being given to the patient.  Patient confirmed procedure site prior to any sedation given to the patient.  A 17m 22ga Stimuplex needle was used. Sterile prep,hand hygiene and sterile gloves were used.  Minimal sedation used for procedure.  No paresthesia endorsed by patient during the procedure.  Negative aspiration and negative test dose prior to incremental administration of local anesthetic. The patient tolerated the procedure well with no immediate complications. ? ? ? ?
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
## 2021-07-20 NOTE — Anesthesia Postprocedure Evaluation (Signed)
Anesthesia Post Note ? ?Patient: Ronnika Collett Geist ? ?Procedure(s) Performed: Right shoulder arthroscopy with debridement, decompression, repair of a massive rotator cuff tear, and biceps tenodesis (Right: Shoulder) ? ?Patient location during evaluation: PACU ?Anesthesia Type: General ?Level of consciousness: awake and alert ?Pain management: pain level controlled ?Vital Signs Assessment: post-procedure vital signs reviewed and stable ?Respiratory status: spontaneous breathing, nonlabored ventilation and respiratory function stable ?Cardiovascular status: blood pressure returned to baseline and stable ?Postop Assessment: no apparent nausea or vomiting ?Anesthetic complications: no ? ? ?No notable events documented. ? ? ?Last Vitals:  ?Vitals:  ? 07/20/21 1713 07/20/21 1726  ?BP: 134/64 128/77  ?Pulse: 76 80  ?Resp: 16 16  ?Temp: (!) 36 ?C 36.7 ?C  ?SpO2: 93% 94%  ?  ?Last Pain:  ?Vitals:  ? 07/20/21 1726  ?PainSc: 0-No pain  ? ? ?  ?  ?  ?  ?  ?  ? ?Iran Ouch ? ? ? ? ?

## 2021-07-20 NOTE — Anesthesia Preprocedure Evaluation (Addendum)
Anesthesia Evaluation  ?Patient identified by MRN, date of birth, ID band ?Patient awake ? ? ? ?Reviewed: ?Allergy & Precautions, NPO status , Patient's Chart, lab work & pertinent test results ? ?Airway ?Mallampati: II ? ?TM Distance: >3 FB ?Neck ROM: full ? ? ? Dental ?no notable dental hx. ? ?  ?Pulmonary ?neg pulmonary ROS,  ?  ?Pulmonary exam normal ? ? ? ? ? ? ? Cardiovascular ?Exercise Tolerance: Good ?Normal cardiovascular exam ? ?Heart palpitations ?  ?Neuro/Psych ?Transient global amnesia 2020 ?negative psych ROS  ? GI/Hepatic ?Neg liver ROS, GERD  Controlled and Medicated,  ?Endo/Other  ?negative endocrine ROS ? Renal/GU ?  ? ?  ?Musculoskeletal ? ? Abdominal ?Normal abdominal exam  (+)   ?Peds ? Hematology ?negative hematology ROS ?(+)   ?Anesthesia Other Findings ?Past Medical History: ?No date: Allergy ?No date: Arthritis ?    Comment:  right shoulder and neck ?No date: BV (bacterial vaginosis) ?2010: Cervical high risk human papillomavirus (HPV) DNA test positive ?No date: Diverticulosis ?2016: Genital herpes ?No date: GERD (gastroesophageal reflux disease) ?No date: Glaucoma (increased eye pressure) ?No date: Headache ?    Comment:  migraines as a child ?No date: Heart murmur ?    Comment:  was told once by pcp ?No date: Heart palpitations ?No date: Osteopenia ?01/2019: Transient global amnesia ? ?Past Surgical History: ?2018: COLONOSCOPY ?    Comment:  neg; repeat in 5 yrs due to [place] polyps ?1979: CRYOTHERAPY ?No date: FACELIFT ?No date: FOOT SURGERY; Left ?No date: LIPOSUCTION ?No date: TONSILLECTOMY ? ? ? ? Reproductive/Obstetrics ?negative OB ROS ? ?  ? ? ? ? ? ? ? ? ? ? ? ? ? ?  ?  ? ? ? ? ? ? ? ?Anesthesia Physical ?Anesthesia Plan ? ?ASA: 1 ? ?Anesthesia Plan: General ETT  ? ?Post-op Pain Management: Regional block*  ? ?Induction: Intravenous ? ?PONV Risk Score and Plan: 2 and Ondansetron, Dexamethasone and Midazolam ? ?Airway Management Planned: Oral  ETT ? ?Additional Equipment:  ? ?Intra-op Plan:  ? ?Post-operative Plan: Extubation in OR ? ?Informed Consent: I have reviewed the patients History and Physical, chart, labs and discussed the procedure including the risks, benefits and alternatives for the proposed anesthesia with the patient or authorized representative who has indicated his/her understanding and acceptance.  ? ? ? ?Dental advisory given ? ?Plan Discussed with: Anesthesiologist, CRNA and Surgeon ? ?Anesthesia Plan Comments:   ? ? ? ? ? ?Anesthesia Quick Evaluation ? ?

## 2021-07-20 NOTE — Anesthesia Procedure Notes (Signed)
Procedure Name: Intubation ?Date/Time: 07/20/2021 1:43 PM ?Performed by: Fredderick Phenix, CRNA ?Pre-anesthesia Checklist: Patient identified, Emergency Drugs available, Suction available and Patient being monitored ?Patient Re-evaluated:Patient Re-evaluated prior to induction ?Oxygen Delivery Method: Circle system utilized ?Preoxygenation: Pre-oxygenation with 100% oxygen ?Induction Type: IV induction ?Ventilation: Mask ventilation without difficulty ?Laryngoscope Size: McGraph and 3 ?Grade View: Grade I ?Tube type: Oral ?Tube size: 7.0 mm ?Number of attempts: 1 ?Airway Equipment and Method: Stylet ?Placement Confirmation: ETT inserted through vocal cords under direct vision, positive ETCO2 and breath sounds checked- equal and bilateral ?Secured at: 22 cm ?Tube secured with: Tape ?Dental Injury: Teeth and Oropharynx as per pre-operative assessment  ?Comments: Placed by Lucita Ferrara, SRNA under supervision of MDA and CRNA ? ? ? ? ?

## 2021-07-20 NOTE — Discharge Instructions (Addendum)
Orthopedic discharge instructions: ?Keep dressing dry and intact.  ?May sponge bathe after dressing changed on post-op day #4 (Saturday).  ?Cover staples with Band-Aids after drying off. ?Apply ice frequently to shoulder or use polar care device. ?Take ibuprofen 600-800 mg TID with meals for 7-10 days, then as necessary. ?Take oxycodone as prescribed when needed.  ?May supplement with ES Tylenol if necessary. ?Keep shoulder immobilizer on at all times except may remove for bathing purposes. ?Follow-up in 10-14 days or as scheduled.  ? ? ? ?AMBULATORY SURGERY  ?DISCHARGE INSTRUCTIONS ? ? ?The drugs that you were given will stay in your system until tomorrow so for the next 24 hours you should not: ? ?Drive an automobile ?Make any legal decisions ?Drink any alcoholic beverage ? ? ?You may resume regular meals tomorrow.  Today it is better to start with liquids and gradually work up to solid foods. ? ?You may eat anything you prefer, but it is better to start with liquids, then soup and crackers, and gradually work up to solid foods. ? ? ?Please notify your doctor immediately if you have any unusual bleeding, trouble breathing, redness and pain at the surgery site, drainage, fever, or pain not relieved by medication. ? ?  ? ?Additional Instructions:  ? ?Alternate tylenol '1000mg'$  with ibuprofen '800mg'$  every 4 hours.  Do not take meloxicam while on the ibuprofen ? ? ? ? ? ? ? ? ? ? ? ?
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
## 2021-07-22 ENCOUNTER — Encounter: Payer: Self-pay | Admitting: Surgery

## 2021-09-06 ENCOUNTER — Ambulatory Visit: Payer: PRIVATE HEALTH INSURANCE | Admitting: Podiatry

## 2021-09-14 ENCOUNTER — Ambulatory Visit (INDEPENDENT_AMBULATORY_CARE_PROVIDER_SITE_OTHER): Payer: PRIVATE HEALTH INSURANCE | Admitting: Podiatry

## 2021-09-14 ENCOUNTER — Encounter: Payer: Self-pay | Admitting: Podiatry

## 2021-09-14 DIAGNOSIS — L600 Ingrowing nail: Secondary | ICD-10-CM | POA: Diagnosis not present

## 2021-09-14 MED ORDER — GENTAMICIN SULFATE 0.1 % EX CREA: 1.0000 "application " | TOPICAL_CREAM | Freq: Two times a day (BID) | CUTANEOUS | 1 refills | Status: AC

## 2021-09-14 NOTE — Progress Notes (Signed)
? ?  HPI: 66 y.o. female presenting today for evaluation of inflammation and tenderness to the medial border of the left great toe after a pedicure.  Patient states that the pedicurist 'yanked' a portion of toenail from the medial border of the left great toe which was very painful.  It is very sensitive to touch.  She presents for further treatment and evaluation ? ?Past Medical History:  ?Diagnosis Date  ? Allergy   ? Arthritis   ? right shoulder and neck  ? BV (bacterial vaginosis)   ? Cervical high risk human papillomavirus (HPV) DNA test positive 2010  ? Diverticulosis   ? Genital herpes 2016  ? GERD (gastroesophageal reflux disease)   ? Glaucoma (increased eye pressure)   ? Headache   ? migraines as a child  ? Heart murmur   ? was told once by pcp  ? Heart palpitations   ? Osteopenia   ? 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 ? ?Past Surgical History:  ?Procedure Laterality Date  ? COLONOSCOPY  2018  ? neg; repeat in 5 yrs due to [place] polyps  ? CRYOTHERAPY  1979  ? FACELIFT    ? FOOT SURGERY Left   ? LIPOSUCTION    ? SHOULDER ARTHROSCOPY WITH SUBACROMIAL DECOMPRESSION, ROTATOR CUFF REPAIR AND BICEP TENDON REPAIR Right 07/20/2021  ? Procedure: Right shoulder arthroscopy with debridement, decompression, repair of a massive rotator cuff tear, and biceps tenodesis;  Surgeon: Corky Mull, MD;  Location: [facility];  Service: Orthopedics;  Laterality: Right;  ? TONSILLECTOMY    ? ? ?Allergies  ?Allergen Reactions  ? Neosporin [Bacitracin-Polymyxin B] Rash  ? ?  ?Physical Exam: ?General: The patient is alert and oriented x3 in no acute distress. ? ?Dermatology: There is some slight incurvated nail with inflammation to the medial border of the left great toe.  No drainage. ? ?Vascular: Palpable pedal pulses bilaterally. Capillary refill within normal limits.  Negative for any significant edema or erythema ? ?Neurological: Light touch and protective threshold grossly intact ? ?Musculoskeletal Exam: No pedal deformities  noted ? ?Assessment: ?1.  Ingrown toenail left medial ? ? ?Plan of Care:  ?1. Patient evaluated.  ?2.  Light debridement of the area was performed and the patient felt significant relief. ?3.  Prescription for gentamicin cream apply daily as needed ?4.  Return to clinic as needed ?  ?  ?Edrick Kins, DPM ?[place] ? ?Dr. Edrick Kins, DPM  ?  ?[address]                                        ?[place], [place] [address]                ?Office [phone]  ?Fax [phone] ? ? ? ? ?

## 2021-11-10 ENCOUNTER — Ambulatory Visit
Admission: RE | Admit: 2021-11-10 | Discharge: 2021-11-10 | Disposition: A | Discharge status: Home / Self Care | Payer: 59 | Source: Ambulatory Visit | Attending: Obstetrics and Gynecology | Admitting: Obstetrics and Gynecology

## 2021-11-10 DIAGNOSIS — Z1231 Encounter for screening mammogram for malignant neoplasm of breast: Secondary | ICD-10-CM | POA: Insufficient documentation

## 2021-12-18 ENCOUNTER — Other Ambulatory Visit: Payer: Self-pay | Admitting: Obstetrics and Gynecology

## 2021-12-18 DIAGNOSIS — A6004 Herpesviral vulvovaginitis: Secondary | ICD-10-CM

## 2022-10-05 ENCOUNTER — Other Ambulatory Visit: Payer: Self-pay | Admitting: Internal Medicine

## 2022-10-05 DIAGNOSIS — Z1231 Encounter for screening mammogram for malignant neoplasm of breast: Secondary | ICD-10-CM

## 2022-11-21 IMAGING — MG MM DIGITAL SCREENING BILAT W/ TOMO AND CAD
8 series · 8 of 24 positions shown · non-contrast
Comparison: Previous exam(s).

CLINICAL DATA: Screening.

EXAM:
DIGITAL SCREENING BILATERAL MAMMOGRAM WITH TOMOSYNTHESIS AND CAD
TECHNIQUE: Bilateral screening digital craniocaudal and mediolateral oblique
mammograms were obtained. Bilateral screening digital breast
tomosynthesis was performed. The images were evaluated with
computer-aided detection.

[R CC synth-2D]
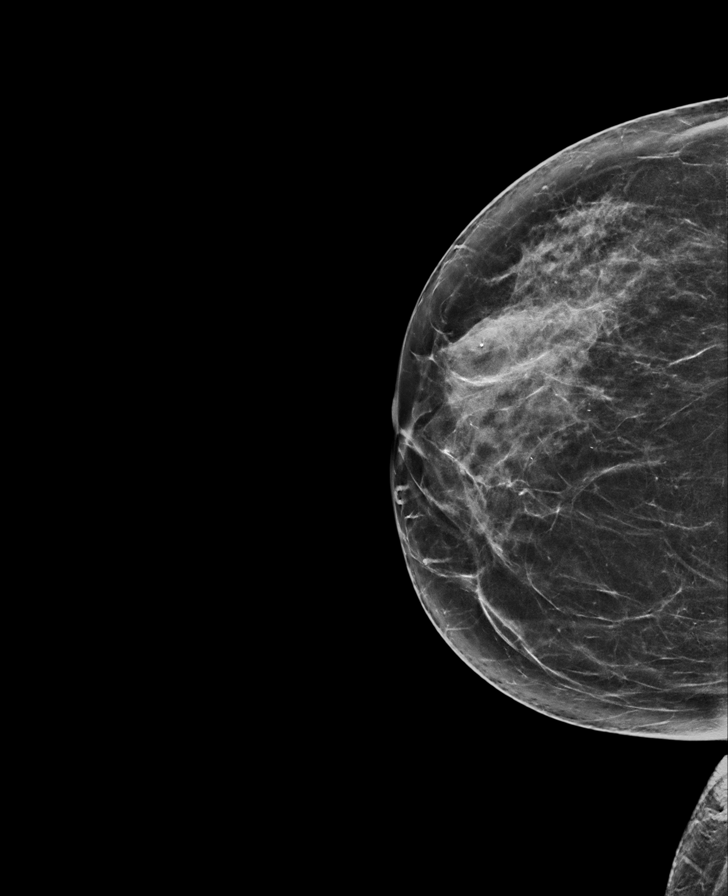

[R MLO synth-2D]
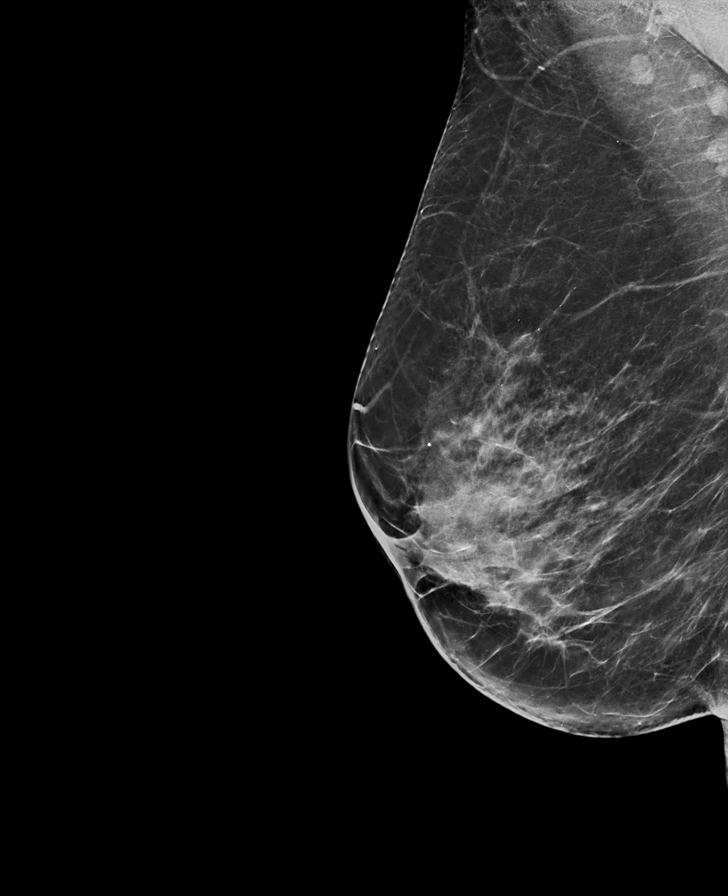

[L MLO synth-2D]
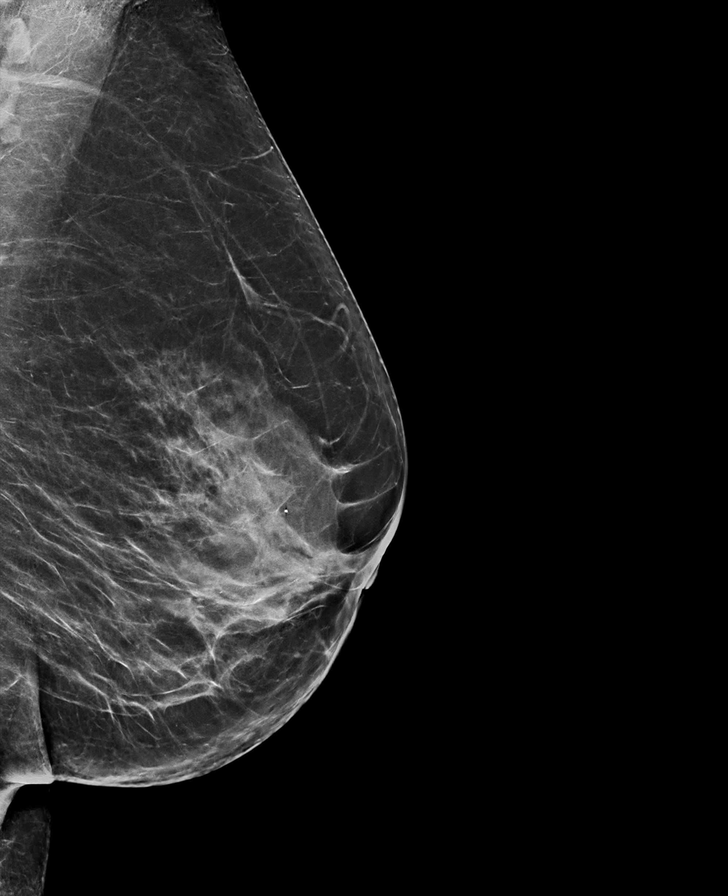

[L CC synth-2D]
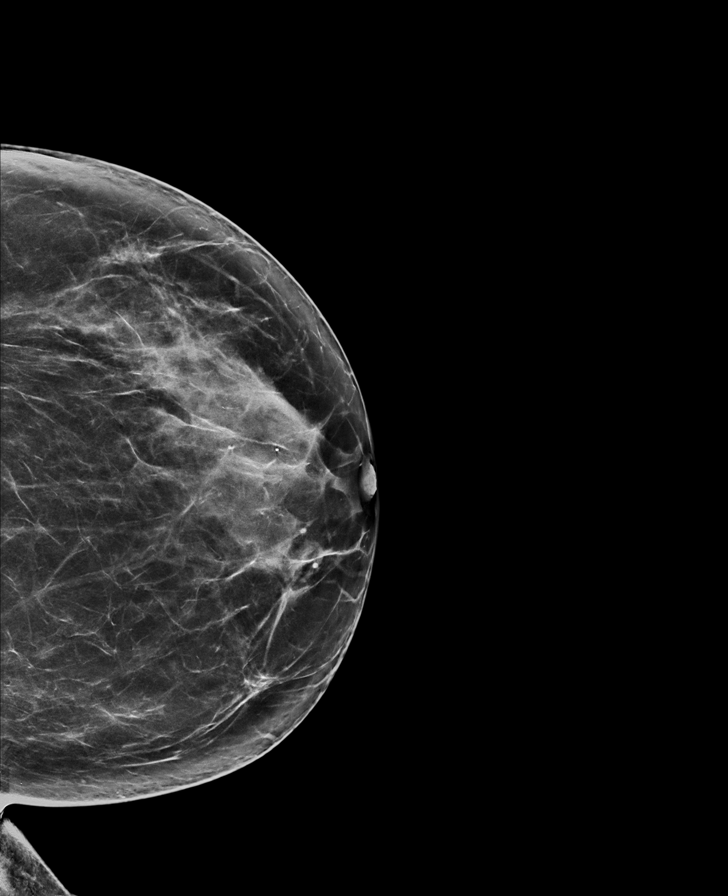

[R MLO tomo · tomo slice 38/75.0]
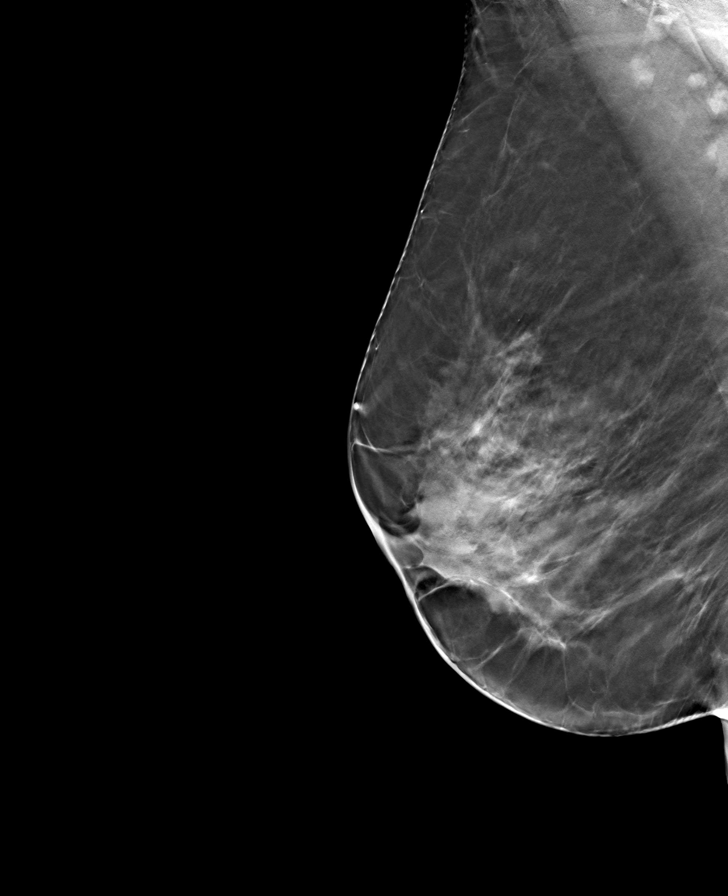

[L MLO tomo · tomo slice 39/77.0]
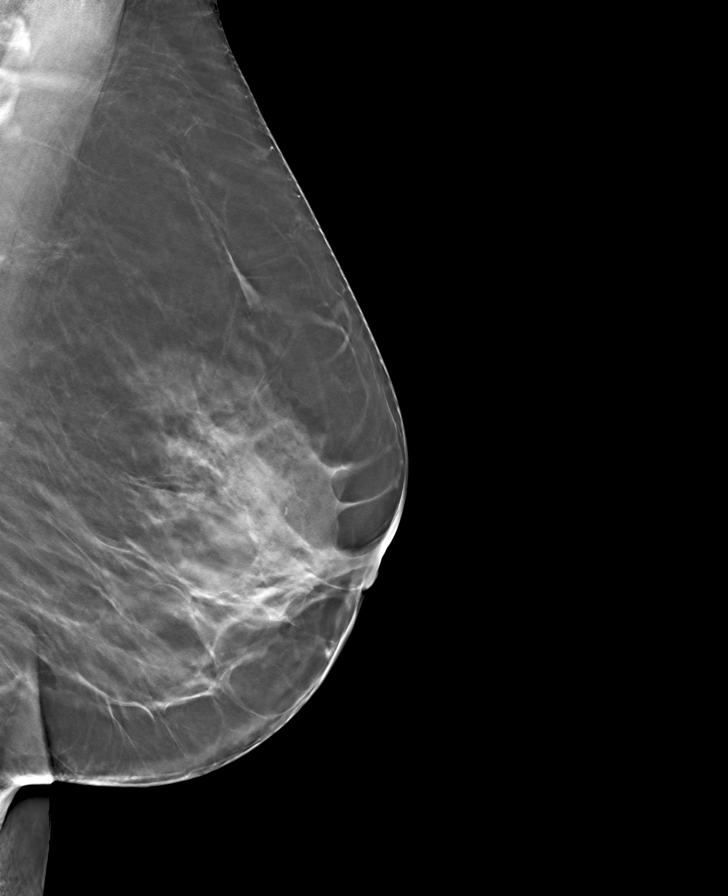

[L CC tomo · tomo slice 41/80.0]
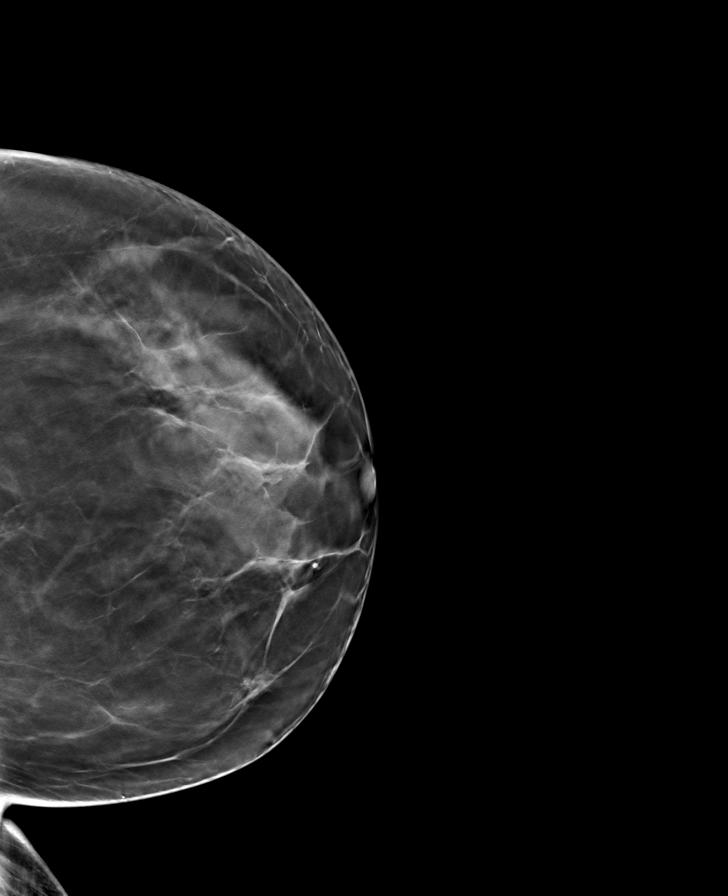

[R CC tomo · tomo slice 37/72.0]
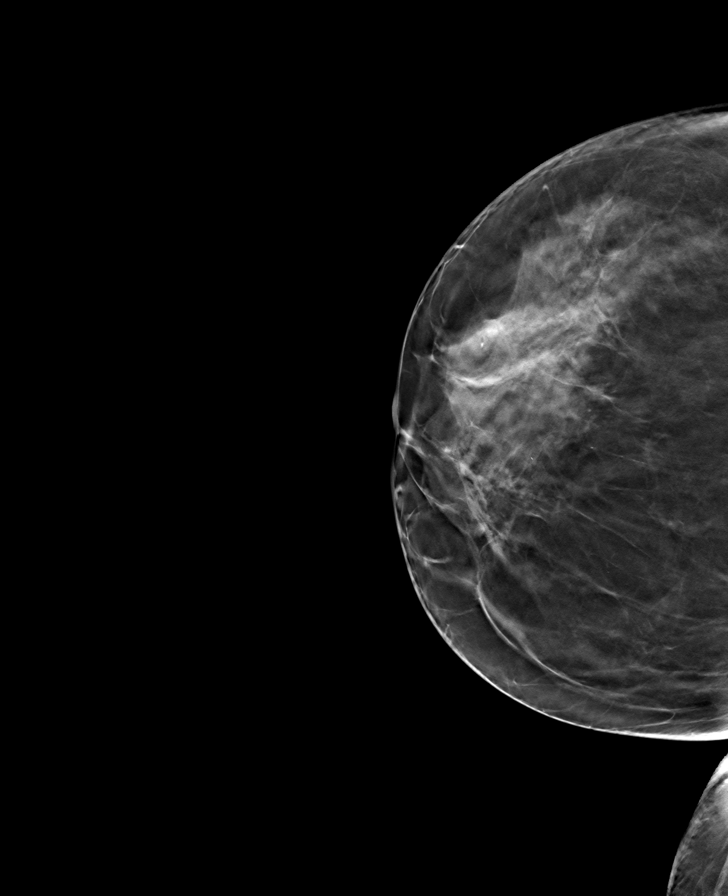

[8 of 24 positions shown; findings below may reference images not displayed]

ACR Breast Density Category c: The breast tissue is heterogeneously
dense, which may obscure small masses.
FINDINGS: There are no findings suspicious for malignancy.
IMPRESSION: No mammographic evidence of malignancy. A result letter of this
screening mammogram will be mailed directly to the patient.

RECOMMENDATION:
Screening mammogram in one year. (Code:Q3-W-BC3)

BI-RADS CATEGORY  1: Negative.

## 2022-11-22 ENCOUNTER — Ambulatory Visit
Admission: RE | Admit: 2022-11-22 | Discharge: 2022-11-22 | Disposition: A | Discharge status: Home / Self Care | Payer: Managed Care, Other (non HMO) | Source: Ambulatory Visit | Attending: Internal Medicine | Admitting: Internal Medicine

## 2022-11-22 DIAGNOSIS — Z1231 Encounter for screening mammogram for malignant neoplasm of breast: Secondary | ICD-10-CM | POA: Insufficient documentation

## 2023-07-08 HISTORY — PX: REDUCTION MAMMAPLASTY: SUR839

## 2023-08-02 ENCOUNTER — Other Ambulatory Visit: Payer: Self-pay | Admitting: Plastic Surgery

## 2023-08-04 LAB — SURGICAL PATHOLOGY

## 2023-10-09 ENCOUNTER — Other Ambulatory Visit: Payer: Self-pay | Admitting: Internal Medicine

## 2023-10-09 DIAGNOSIS — Z1231 Encounter for screening mammogram for malignant neoplasm of breast: Secondary | ICD-10-CM

## 2024-01-10 ENCOUNTER — Ambulatory Visit

## 2024-03-05 ENCOUNTER — Ambulatory Visit
Admission: RE | Admit: 2024-03-05 | Discharge: 2024-03-05 | Disposition: A | Discharge status: Home / Self Care | Source: Ambulatory Visit | Attending: Internal Medicine | Admitting: Internal Medicine

## 2024-03-05 DIAGNOSIS — Z1231 Encounter for screening mammogram for malignant neoplasm of breast: Secondary | ICD-10-CM | POA: Diagnosis present
